# Patient Record
Sex: Female | Born: 1935 | Race: White | Hispanic: No
Health system: Southern US, Community
[De-identification: ages and names within clinical notes are randomized; demographics above are authoritative.]

## PROBLEM LIST (undated history)

## (undated) DIAGNOSIS — D649 Anemia, unspecified: Secondary | ICD-10-CM

## (undated) DIAGNOSIS — K219 Gastro-esophageal reflux disease without esophagitis: Secondary | ICD-10-CM

## (undated) DIAGNOSIS — D72819 Decreased white blood cell count, unspecified: Secondary | ICD-10-CM

## (undated) DIAGNOSIS — G20 Parkinson's disease: Secondary | ICD-10-CM

## (undated) DIAGNOSIS — G20A1 Parkinson's disease without dyskinesia, without mention of fluctuations: Secondary | ICD-10-CM

## (undated) DIAGNOSIS — R7611 Nonspecific reaction to tuberculin skin test without active tuberculosis: Secondary | ICD-10-CM

## (undated) DIAGNOSIS — I219 Acute myocardial infarction, unspecified: Secondary | ICD-10-CM

## (undated) DIAGNOSIS — J45909 Unspecified asthma, uncomplicated: Secondary | ICD-10-CM

## (undated) DIAGNOSIS — I447 Left bundle-branch block, unspecified: Secondary | ICD-10-CM

## (undated) DIAGNOSIS — I2510 Atherosclerotic heart disease of native coronary artery without angina pectoris: Secondary | ICD-10-CM

## (undated) DIAGNOSIS — G4700 Insomnia, unspecified: Secondary | ICD-10-CM

## (undated) DIAGNOSIS — F411 Generalized anxiety disorder: Secondary | ICD-10-CM

## (undated) HISTORY — DX: Insomnia, unspecified: G47.00

## (undated) HISTORY — DX: Parkinson's disease without dyskinesia, without mention of fluctuations: G20.A1

## (undated) HISTORY — DX: Generalized anxiety disorder: F41.1

## (undated) HISTORY — PX: TONSILLECTOMY: SUR1361

## (undated) HISTORY — DX: Parkinson's disease: G20

## (undated) HISTORY — DX: Decreased white blood cell count, unspecified: D72.819

## (undated) HISTORY — DX: Unspecified asthma, uncomplicated: J45.909

## (undated) HISTORY — DX: Anemia, unspecified: D64.9

## (undated) HISTORY — DX: Gastro-esophageal reflux disease without esophagitis: K21.9

## (undated) HISTORY — DX: Nonspecific reaction to tuberculin skin test without active tuberculosis: R76.11

## (undated) HISTORY — DX: Acute myocardial infarction, unspecified: I21.9

## (undated) HISTORY — DX: Left bundle-branch block, unspecified: I44.7

## (undated) HISTORY — DX: Atherosclerotic heart disease of native coronary artery without angina pectoris: I25.10

## (undated) HISTORY — PX: CHOLECYSTECTOMY: SHX55

---

## 1998-01-26 ENCOUNTER — Ambulatory Visit (HOSPITAL_COMMUNITY): Admission: RE | Admit: 1998-01-26 | Discharge: 1998-01-26 | Payer: Self-pay | Admitting: Gastroenterology

## 1998-06-01 ENCOUNTER — Encounter (HOSPITAL_COMMUNITY): Admission: RE | Admit: 1998-06-01 | Discharge: 1998-08-30 | Payer: Self-pay | Admitting: Internal Medicine

## 1998-07-20 ENCOUNTER — Other Ambulatory Visit: Admission: RE | Admit: 1998-07-20 | Discharge: 1998-07-20 | Payer: Self-pay | Admitting: Obstetrics and Gynecology

## 1998-09-01 ENCOUNTER — Encounter (HOSPITAL_COMMUNITY): Admission: RE | Admit: 1998-09-01 | Discharge: 1998-11-30 | Payer: Self-pay | Admitting: Internal Medicine

## 2000-10-18 ENCOUNTER — Other Ambulatory Visit: Admission: RE | Admit: 2000-10-18 | Discharge: 2000-10-18 | Payer: Self-pay | Admitting: Vascular Surgery

## 2000-11-30 ENCOUNTER — Other Ambulatory Visit: Admission: RE | Admit: 2000-11-30 | Discharge: 2000-11-30 | Payer: Self-pay | Admitting: Obstetrics and Gynecology

## 2000-11-30 ENCOUNTER — Encounter (INDEPENDENT_AMBULATORY_CARE_PROVIDER_SITE_OTHER): Payer: Self-pay

## 2002-03-13 ENCOUNTER — Ambulatory Visit (HOSPITAL_COMMUNITY): Admission: RE | Admit: 2002-03-13 | Discharge: 2002-03-13 | Payer: Self-pay | Admitting: Gastroenterology

## 2002-03-13 ENCOUNTER — Encounter: Payer: Self-pay | Admitting: Gastroenterology

## 2002-06-03 ENCOUNTER — Inpatient Hospital Stay (HOSPITAL_COMMUNITY): Admission: EM | Admit: 2002-06-03 | Discharge: 2002-06-05 | Payer: Self-pay | Admitting: Emergency Medicine

## 2004-06-08 ENCOUNTER — Ambulatory Visit: Payer: Self-pay | Admitting: Internal Medicine

## 2004-09-05 ENCOUNTER — Ambulatory Visit: Payer: Self-pay | Admitting: Internal Medicine

## 2004-12-29 ENCOUNTER — Ambulatory Visit: Payer: Self-pay | Admitting: Internal Medicine

## 2006-07-05 ENCOUNTER — Ambulatory Visit: Payer: Self-pay | Admitting: Family Medicine

## 2006-07-05 LAB — CONVERTED CEMR LAB
ALT: 11 units/L (ref 0–40)
AST: 31 units/L (ref 0–37)
Albumin: 4 g/dL (ref 3.5–5.2)
BUN: 19 mg/dL (ref 6–23)
Bilirubin, Direct: 0.1 mg/dL (ref 0.0–0.3)
Chloride: 103 meq/L (ref 96–112)
Cholesterol: 207 mg/dL (ref 0–200)
GFR calc Af Amer: 68 mL/min
Glucose, Bld: 97 mg/dL (ref 70–99)
HDL: 52.5 mg/dL (ref >39.0)
Potassium: 4 meq/L (ref 3.5–5.1)
Total Bilirubin: 0.6 mg/dL (ref 0.3–1.2)
Total CHOL/HDL Ratio: 3.9

## 2006-07-19 ENCOUNTER — Ambulatory Visit: Payer: Self-pay | Admitting: Family Medicine

## 2006-07-26 ENCOUNTER — Ambulatory Visit: Payer: Self-pay | Admitting: Family Medicine

## 2006-07-26 ENCOUNTER — Encounter: Admission: RE | Admit: 2006-07-26 | Discharge: 2006-07-26 | Payer: Self-pay | Admitting: Family Medicine

## 2006-08-03 ENCOUNTER — Ambulatory Visit: Payer: Self-pay | Admitting: Family Medicine

## 2006-08-17 ENCOUNTER — Ambulatory Visit: Payer: Self-pay | Admitting: Internal Medicine

## 2006-12-06 ENCOUNTER — Ambulatory Visit: Payer: Self-pay | Admitting: Family Medicine

## 2006-12-06 DIAGNOSIS — I10 Essential (primary) hypertension: Secondary | ICD-10-CM | POA: Insufficient documentation

## 2006-12-06 DIAGNOSIS — G20 Parkinson's disease: Secondary | ICD-10-CM | POA: Insufficient documentation

## 2006-12-07 DIAGNOSIS — F411 Generalized anxiety disorder: Secondary | ICD-10-CM | POA: Insufficient documentation

## 2006-12-09 LAB — CONVERTED CEMR LAB
BUN: 20 mg/dL (ref 6–23)
CO2: 23 meq/L (ref 19–32)
Calcium: 10.4 mg/dL (ref 8.4–10.5)
Chloride: 104 meq/L (ref 96–112)
Creatinine, Ser: 1.06 mg/dL (ref 0.40–1.20)
Glucose, Bld: 96 mg/dL (ref 70–99)
Potassium: 4.5 meq/L (ref 3.5–5.3)

## 2006-12-10 ENCOUNTER — Telehealth (INDEPENDENT_AMBULATORY_CARE_PROVIDER_SITE_OTHER): Payer: Self-pay | Admitting: Family Medicine

## 2007-02-21 ENCOUNTER — Ambulatory Visit: Payer: Self-pay | Admitting: Internal Medicine

## 2007-03-10 ENCOUNTER — Emergency Department (HOSPITAL_COMMUNITY): Admission: EM | Admit: 2007-03-10 | Discharge: 2007-03-10 | Payer: Self-pay | Admitting: Emergency Medicine

## 2007-03-14 ENCOUNTER — Encounter (INDEPENDENT_AMBULATORY_CARE_PROVIDER_SITE_OTHER): Payer: Self-pay | Admitting: Family Medicine

## 2007-03-19 DIAGNOSIS — I2510 Atherosclerotic heart disease of native coronary artery without angina pectoris: Secondary | ICD-10-CM | POA: Insufficient documentation

## 2007-03-19 DIAGNOSIS — J441 Chronic obstructive pulmonary disease with (acute) exacerbation: Secondary | ICD-10-CM

## 2007-03-19 DIAGNOSIS — K219 Gastro-esophageal reflux disease without esophagitis: Secondary | ICD-10-CM | POA: Insufficient documentation

## 2007-03-19 DIAGNOSIS — I447 Left bundle-branch block, unspecified: Secondary | ICD-10-CM | POA: Insufficient documentation

## 2007-04-08 ENCOUNTER — Encounter (INDEPENDENT_AMBULATORY_CARE_PROVIDER_SITE_OTHER): Payer: Self-pay | Admitting: Family Medicine

## 2007-05-06 ENCOUNTER — Inpatient Hospital Stay (HOSPITAL_COMMUNITY): Admission: EM | Admit: 2007-05-06 | Discharge: 2007-05-10 | Payer: Self-pay | Admitting: Emergency Medicine

## 2007-05-06 ENCOUNTER — Ambulatory Visit: Payer: Self-pay | Admitting: Internal Medicine

## 2007-05-08 ENCOUNTER — Ambulatory Visit: Payer: Self-pay | Admitting: Physical Medicine & Rehabilitation

## 2007-05-10 ENCOUNTER — Ambulatory Visit: Payer: Self-pay | Admitting: Physical Medicine & Rehabilitation

## 2007-05-10 ENCOUNTER — Inpatient Hospital Stay (HOSPITAL_COMMUNITY)
Admission: RE | Admit: 2007-05-10 | Discharge: 2007-05-21 | Payer: Self-pay | Admitting: Physical Medicine & Rehabilitation

## 2007-06-09 ENCOUNTER — Inpatient Hospital Stay (HOSPITAL_COMMUNITY): Admission: EM | Admit: 2007-06-09 | Discharge: 2007-06-10 | Payer: Self-pay | Admitting: Emergency Medicine

## 2007-06-09 ENCOUNTER — Ambulatory Visit: Payer: Self-pay | Admitting: *Deleted

## 2007-06-18 ENCOUNTER — Encounter (INDEPENDENT_AMBULATORY_CARE_PROVIDER_SITE_OTHER): Payer: Self-pay | Admitting: Family Medicine

## 2007-06-20 ENCOUNTER — Telehealth (INDEPENDENT_AMBULATORY_CARE_PROVIDER_SITE_OTHER): Payer: Self-pay | Admitting: *Deleted

## 2007-07-09 ENCOUNTER — Encounter: Payer: Self-pay | Admitting: Internal Medicine

## 2007-07-18 ENCOUNTER — Ambulatory Visit: Payer: Self-pay | Admitting: Family Medicine

## 2007-07-19 ENCOUNTER — Encounter (INDEPENDENT_AMBULATORY_CARE_PROVIDER_SITE_OTHER): Payer: Self-pay | Admitting: *Deleted

## 2007-07-19 LAB — CONVERTED CEMR LAB
CO2: 30 meq/L (ref 19–32)
Calcium: 10.1 mg/dL (ref 8.4–10.5)
Creatinine, Ser: 1 mg/dL (ref 0.4–1.2)
GFR calc Af Amer: 68 mL/min
Potassium: 4.7 meq/L (ref 3.5–5.1)

## 2007-07-26 ENCOUNTER — Ambulatory Visit: Payer: Self-pay | Admitting: Internal Medicine

## 2007-08-07 ENCOUNTER — Telehealth (INDEPENDENT_AMBULATORY_CARE_PROVIDER_SITE_OTHER): Payer: Self-pay | Admitting: *Deleted

## 2007-08-08 ENCOUNTER — Ambulatory Visit: Payer: Self-pay | Admitting: Family Medicine

## 2007-08-13 LAB — CONVERTED CEMR LAB
Basophils Absolute: 0 10*3/uL (ref 0.0–0.1)
Eosinophils Absolute: 0.1 10*3/uL (ref 0.0–0.6)
Eosinophils Relative: 1.4 % (ref 0.0–5.0)
HCT: 35.9 % — ABNORMAL LOW (ref 36.0–46.0)
MCHC: 33.1 g/dL (ref 30.0–36.0)

## 2007-08-14 ENCOUNTER — Telehealth (INDEPENDENT_AMBULATORY_CARE_PROVIDER_SITE_OTHER): Payer: Self-pay | Admitting: *Deleted

## 2007-08-15 ENCOUNTER — Telehealth (INDEPENDENT_AMBULATORY_CARE_PROVIDER_SITE_OTHER): Payer: Self-pay | Admitting: *Deleted

## 2007-08-15 ENCOUNTER — Ambulatory Visit: Payer: Self-pay | Admitting: Family Medicine

## 2007-08-15 DIAGNOSIS — D72819 Decreased white blood cell count, unspecified: Secondary | ICD-10-CM | POA: Insufficient documentation

## 2007-08-15 DIAGNOSIS — D649 Anemia, unspecified: Secondary | ICD-10-CM

## 2007-08-15 LAB — CONVERTED CEMR LAB
Basophils Absolute: 0 10*3/uL (ref 0.0–0.1)
Eosinophils Absolute: 0.1 10*3/uL (ref 0.0–0.6)
Lymphocytes Relative: 36 % (ref 12.0–46.0)
MCV: 88.4 fL (ref 78.0–100.0)
Monocytes Absolute: 0.2 10*3/uL (ref 0.2–0.7)
Monocytes Relative: 5 % (ref 3.0–11.0)
Neutro Abs: 2.5 10*3/uL (ref 1.4–7.7)
Neutrophils Relative %: 56.6 % (ref 43.0–77.0)
WBC: 4.3 10*3/uL — ABNORMAL LOW (ref 4.5–10.5)

## 2007-08-19 ENCOUNTER — Encounter (INDEPENDENT_AMBULATORY_CARE_PROVIDER_SITE_OTHER): Payer: Self-pay | Admitting: *Deleted

## 2007-08-20 ENCOUNTER — Ambulatory Visit: Payer: Self-pay | Admitting: Hematology and Oncology

## 2007-08-23 ENCOUNTER — Telehealth (INDEPENDENT_AMBULATORY_CARE_PROVIDER_SITE_OTHER): Payer: Self-pay | Admitting: *Deleted

## 2007-10-24 ENCOUNTER — Encounter: Payer: Self-pay | Admitting: Internal Medicine

## 2007-11-14 ENCOUNTER — Ambulatory Visit: Payer: Self-pay | Admitting: Internal Medicine

## 2007-11-14 DIAGNOSIS — G4700 Insomnia, unspecified: Secondary | ICD-10-CM | POA: Insufficient documentation

## 2008-01-02 ENCOUNTER — Ambulatory Visit: Payer: Self-pay | Admitting: Internal Medicine

## 2008-01-09 ENCOUNTER — Ambulatory Visit: Payer: Self-pay | Admitting: Internal Medicine

## 2008-01-09 DIAGNOSIS — R82998 Other abnormal findings in urine: Secondary | ICD-10-CM | POA: Insufficient documentation

## 2008-03-16 ENCOUNTER — Telehealth (INDEPENDENT_AMBULATORY_CARE_PROVIDER_SITE_OTHER): Payer: Self-pay | Admitting: *Deleted

## 2008-03-19 ENCOUNTER — Ambulatory Visit: Payer: Self-pay | Admitting: Internal Medicine

## 2008-03-30 ENCOUNTER — Telehealth (INDEPENDENT_AMBULATORY_CARE_PROVIDER_SITE_OTHER): Payer: Self-pay | Admitting: *Deleted

## 2008-04-28 ENCOUNTER — Telehealth: Payer: Self-pay | Admitting: Internal Medicine

## 2008-05-05 HISTORY — PX: ORIF HIP FRACTURE: SHX2125

## 2008-05-12 ENCOUNTER — Telehealth: Payer: Self-pay | Admitting: Family Medicine

## 2008-05-13 ENCOUNTER — Telehealth: Payer: Self-pay | Admitting: Internal Medicine

## 2008-05-21 ENCOUNTER — Ambulatory Visit: Payer: Self-pay | Admitting: Internal Medicine

## 2008-05-26 ENCOUNTER — Telehealth: Payer: Self-pay | Admitting: Internal Medicine

## 2008-06-29 ENCOUNTER — Telehealth: Payer: Self-pay | Admitting: Internal Medicine

## 2008-07-02 ENCOUNTER — Ambulatory Visit: Payer: Self-pay | Admitting: Internal Medicine

## 2008-07-09 ENCOUNTER — Ambulatory Visit: Payer: Self-pay | Admitting: Internal Medicine

## 2008-07-09 DIAGNOSIS — R0609 Other forms of dyspnea: Secondary | ICD-10-CM | POA: Insufficient documentation

## 2008-07-09 DIAGNOSIS — R0989 Other specified symptoms and signs involving the circulatory and respiratory systems: Secondary | ICD-10-CM

## 2008-07-10 ENCOUNTER — Encounter: Payer: Self-pay | Admitting: Internal Medicine

## 2008-07-16 ENCOUNTER — Ambulatory Visit: Payer: Self-pay | Admitting: Diagnostic Radiology

## 2008-07-16 ENCOUNTER — Emergency Department (HOSPITAL_BASED_OUTPATIENT_CLINIC_OR_DEPARTMENT_OTHER): Admission: EM | Admit: 2008-07-16 | Discharge: 2008-07-16 | Payer: Self-pay | Admitting: Emergency Medicine

## 2008-08-04 ENCOUNTER — Telehealth (INDEPENDENT_AMBULATORY_CARE_PROVIDER_SITE_OTHER): Payer: Self-pay | Admitting: *Deleted

## 2008-09-11 ENCOUNTER — Telehealth (INDEPENDENT_AMBULATORY_CARE_PROVIDER_SITE_OTHER): Payer: Self-pay | Admitting: *Deleted

## 2008-09-17 ENCOUNTER — Telehealth (INDEPENDENT_AMBULATORY_CARE_PROVIDER_SITE_OTHER): Payer: Self-pay | Admitting: *Deleted

## 2008-10-13 ENCOUNTER — Telehealth (INDEPENDENT_AMBULATORY_CARE_PROVIDER_SITE_OTHER): Payer: Self-pay | Admitting: *Deleted

## 2008-10-20 ENCOUNTER — Ambulatory Visit: Payer: Self-pay | Admitting: Internal Medicine

## 2008-11-03 ENCOUNTER — Telehealth (INDEPENDENT_AMBULATORY_CARE_PROVIDER_SITE_OTHER): Payer: Self-pay | Admitting: *Deleted

## 2008-11-05 ENCOUNTER — Telehealth: Payer: Self-pay | Admitting: Internal Medicine

## 2008-11-06 ENCOUNTER — Telehealth (INDEPENDENT_AMBULATORY_CARE_PROVIDER_SITE_OTHER): Payer: Self-pay | Admitting: *Deleted

## 2008-12-23 ENCOUNTER — Telehealth (INDEPENDENT_AMBULATORY_CARE_PROVIDER_SITE_OTHER): Payer: Self-pay | Admitting: *Deleted

## 2008-12-24 ENCOUNTER — Encounter: Admission: RE | Admit: 2008-12-24 | Discharge: 2009-03-08 | Payer: Self-pay | Admitting: Neurology

## 2009-01-05 ENCOUNTER — Ambulatory Visit: Payer: Self-pay | Admitting: Internal Medicine

## 2009-01-05 ENCOUNTER — Telehealth (INDEPENDENT_AMBULATORY_CARE_PROVIDER_SITE_OTHER): Payer: Self-pay | Admitting: *Deleted

## 2009-01-06 ENCOUNTER — Telehealth: Payer: Self-pay | Admitting: Internal Medicine

## 2009-01-07 ENCOUNTER — Telehealth (INDEPENDENT_AMBULATORY_CARE_PROVIDER_SITE_OTHER): Payer: Self-pay | Admitting: *Deleted

## 2009-01-13 ENCOUNTER — Ambulatory Visit: Payer: Self-pay | Admitting: Internal Medicine

## 2009-01-13 DIAGNOSIS — R197 Diarrhea, unspecified: Secondary | ICD-10-CM | POA: Insufficient documentation

## 2009-01-13 DIAGNOSIS — R109 Unspecified abdominal pain: Secondary | ICD-10-CM | POA: Insufficient documentation

## 2009-01-13 LAB — CONVERTED CEMR LAB
Basophils Absolute: 0 10*3/uL (ref 0.0–0.1)
Basophils Relative: 0.6 % (ref 0.0–3.0)
Eosinophils Relative: 1.1 % (ref 0.0–5.0)
HCT: 35.5 % — ABNORMAL LOW (ref 36.0–46.0)
Hemoglobin: 12.2 g/dL (ref 12.0–15.0)
MCHC: 34.3 g/dL (ref 30.0–36.0)
MCV: 91.5 fL (ref 78.0–100.0)
Monocytes Absolute: 0.2 10*3/uL (ref 0.1–1.0)
Monocytes Relative: 4.4 % (ref 3.0–12.0)
Neutrophils Relative %: 67.6 % (ref 43.0–77.0)
RBC: 3.88 M/uL (ref 3.87–5.11)
RDW: 14.3 % (ref 11.5–14.6)
WBC: 5.2 10*3/uL (ref 4.5–10.5)

## 2009-01-14 ENCOUNTER — Encounter (INDEPENDENT_AMBULATORY_CARE_PROVIDER_SITE_OTHER): Payer: Self-pay | Admitting: *Deleted

## 2009-01-15 ENCOUNTER — Telehealth (INDEPENDENT_AMBULATORY_CARE_PROVIDER_SITE_OTHER): Payer: Self-pay | Admitting: *Deleted

## 2009-01-19 ENCOUNTER — Encounter: Payer: Self-pay | Admitting: Internal Medicine

## 2009-01-22 ENCOUNTER — Telehealth: Payer: Self-pay | Admitting: Internal Medicine

## 2009-02-09 IMAGING — CR DG HIP (WITH OR WITHOUT PELVIS) 2-3V*L*
3 series · 3 of 3 positions shown · non-contrast
Comparison: 07/26/06.

CLINICAL DATA: Preoperative respiratory exam for hip fracture. History of asthma and COPD.
 CHEST - 1 VIEW ? 05/06/07 AT 0366 HOURS:

[t hip ap left]
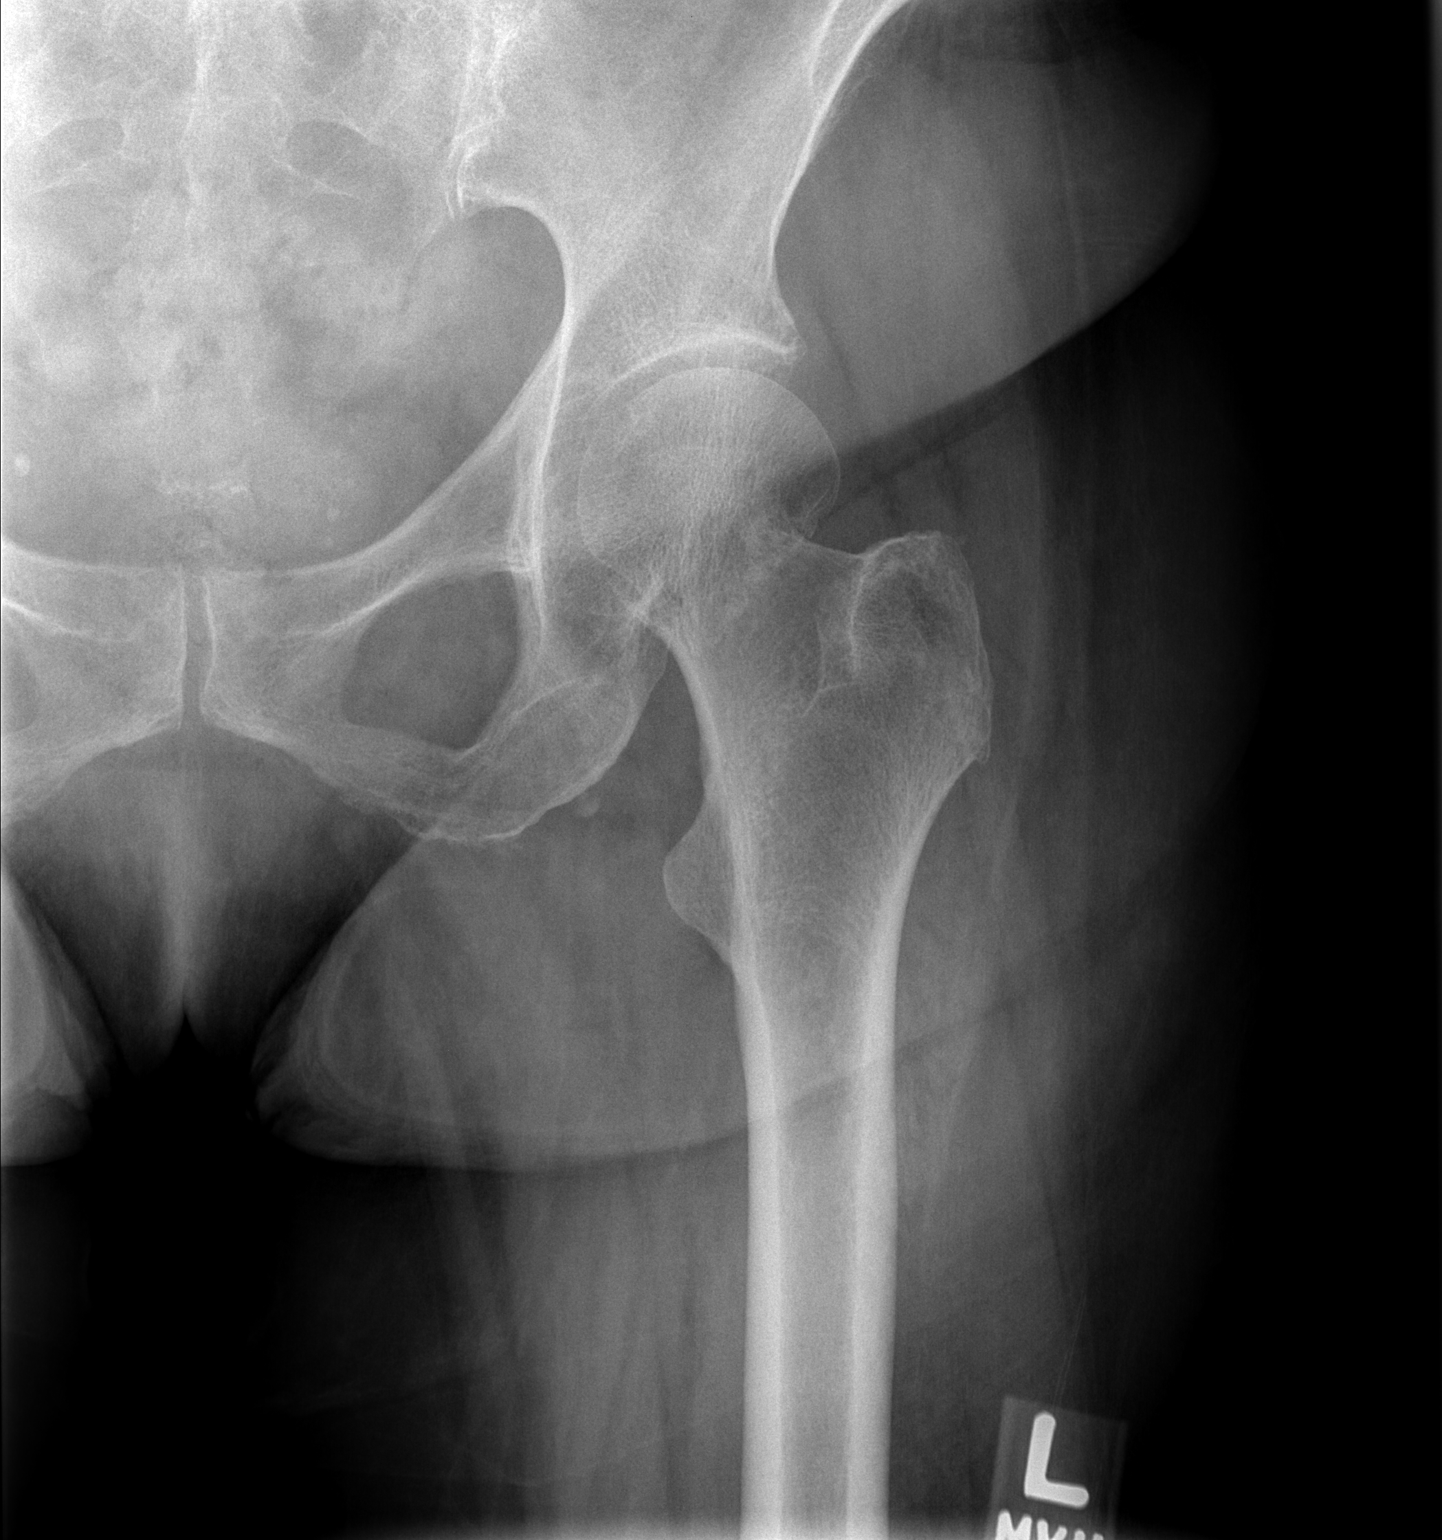

[t pelvis a.p.]
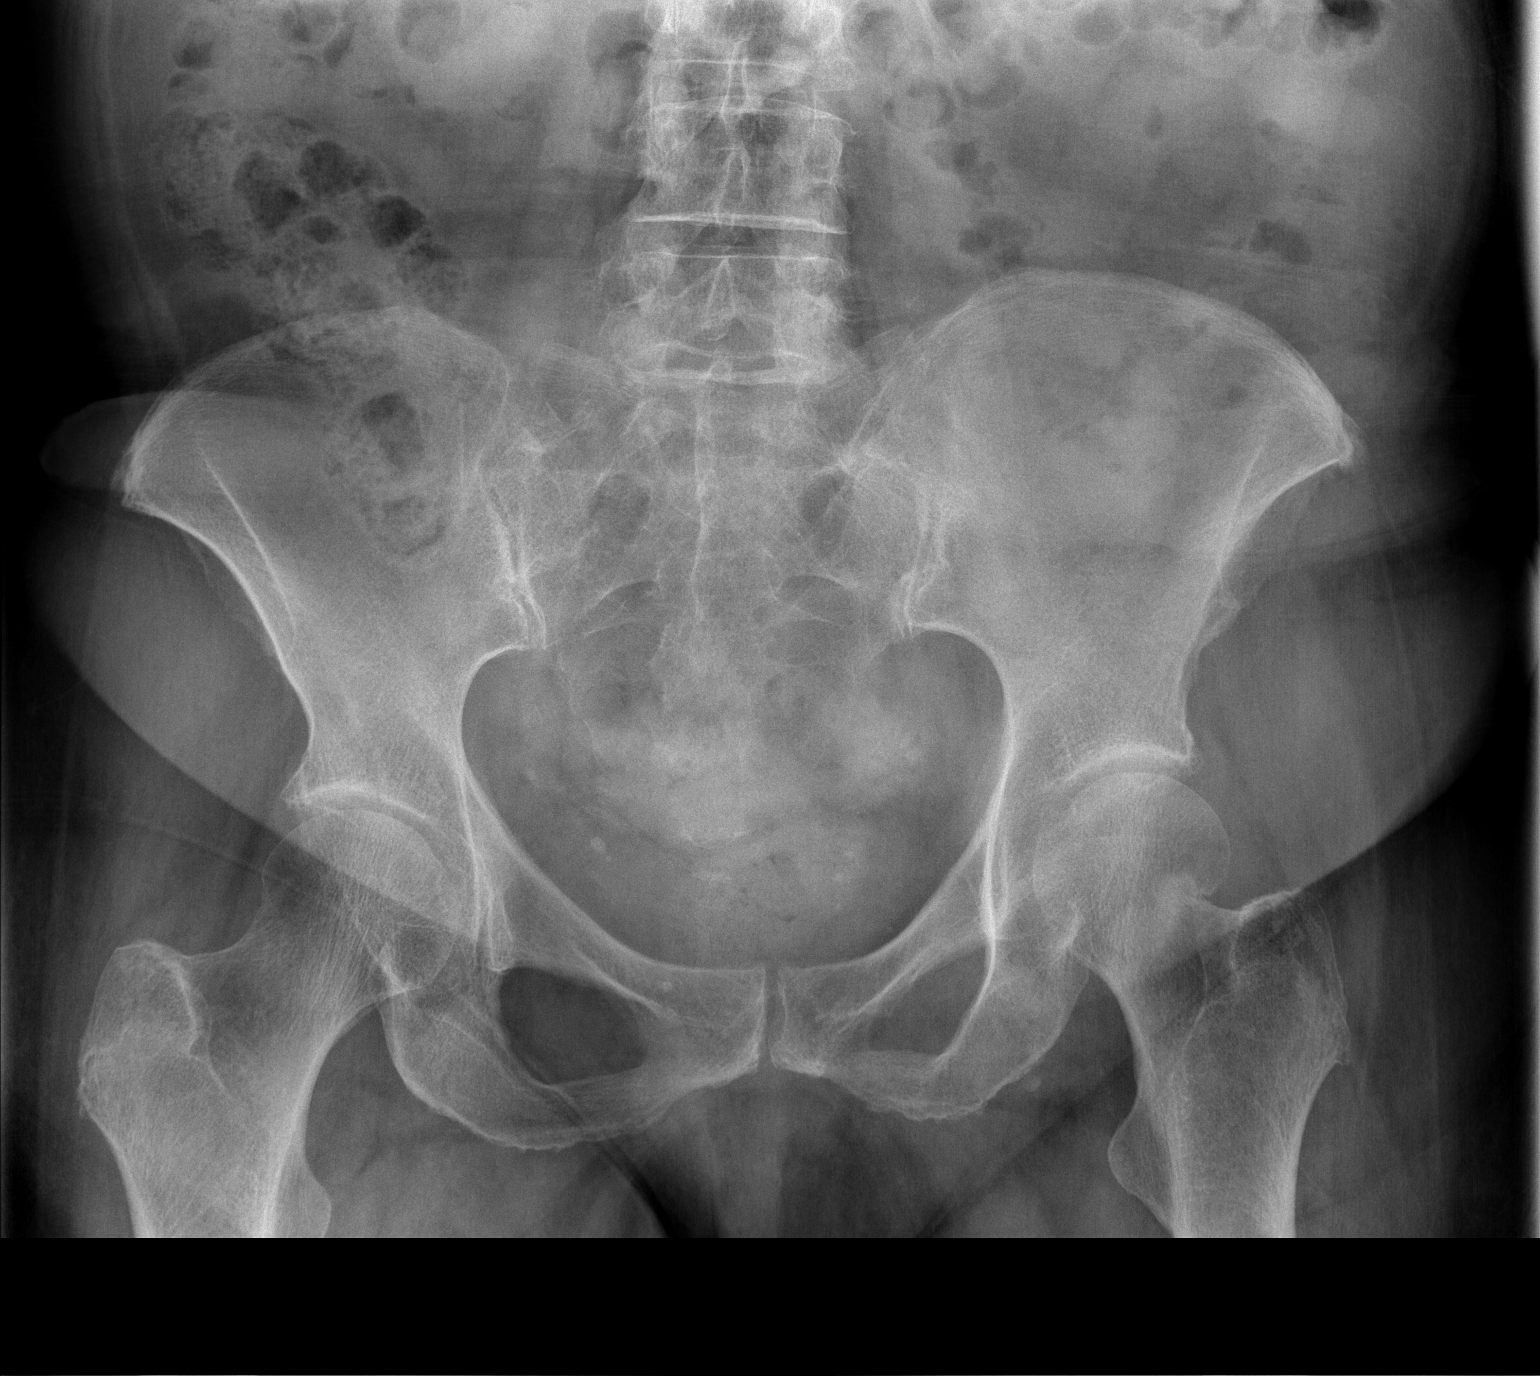

[w hip lateral left *]
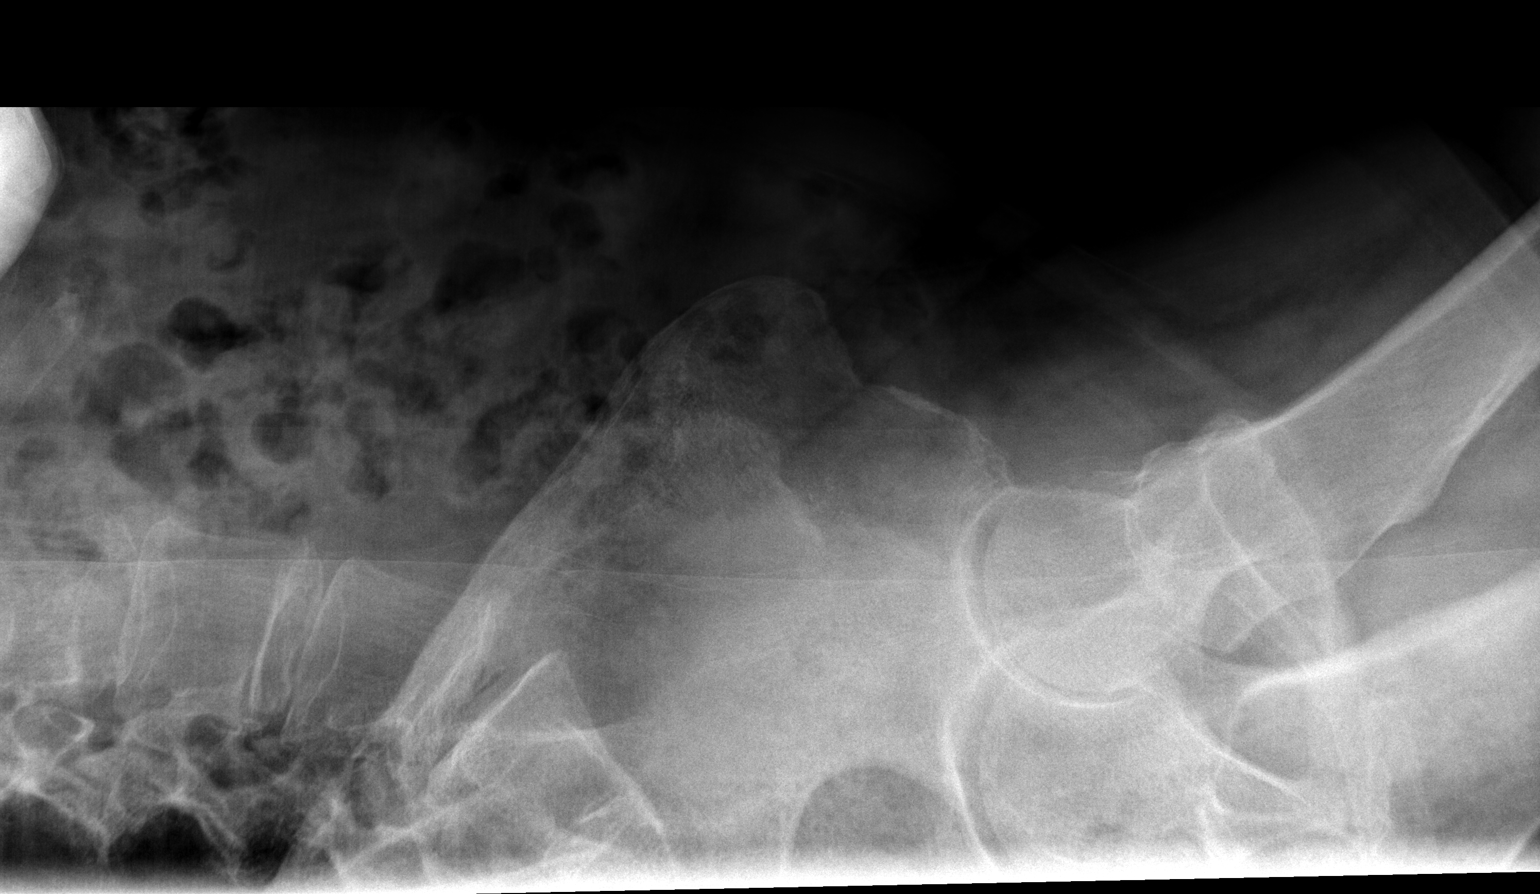

[3 of 3 positions shown; findings below may reference images not displayed]

FINDINGS: Heart size is normal. There is bronchial thickening and some pulmonary scarring, but no evidence of active infiltrate, mass, effusion, or collapse.
IMPRESSION: Bronchial thickening without consolidation or collapse.
 LEFT HIP ? 3 VIEW:
FINDINGS: The patient has a femoral neck fracture angulated medially.  No other regional fracture identified.
IMPRESSION: Left femoral neck fracture.

## 2009-02-15 ENCOUNTER — Telehealth: Payer: Self-pay | Admitting: Internal Medicine

## 2009-02-19 ENCOUNTER — Telehealth: Payer: Self-pay | Admitting: Internal Medicine

## 2009-02-26 ENCOUNTER — Ambulatory Visit: Payer: Self-pay | Admitting: Family Medicine

## 2009-02-26 DIAGNOSIS — K5289 Other specified noninfective gastroenteritis and colitis: Secondary | ICD-10-CM

## 2009-02-26 LAB — CONVERTED CEMR LAB
AST: 45 units/L — ABNORMAL HIGH (ref 0–37)
Albumin: 4.2 g/dL (ref 3.5–5.2)
Alkaline Phosphatase: 73 units/L (ref 39–117)
Bilirubin, Direct: 0.3 mg/dL (ref 0.0–0.3)
Eosinophils Relative: 1 % (ref 0–5)
HCT: 40.5 % (ref 36.0–46.0)
Hemoglobin: 13.2 g/dL (ref 12.0–15.0)
Indirect Bilirubin: 0.8 mg/dL (ref 0.0–0.9)
Lymphocytes Relative: 14 % (ref 12–46)
Neutro Abs: 6.7 10*3/uL (ref 1.7–7.7)
Neutrophils Relative %: 79 % — ABNORMAL HIGH (ref 43–77)
Platelets: 157 10*3/uL (ref 150–400)
Potassium: 4.5 meq/L (ref 3.5–5.3)
RBC: 4.44 M/uL (ref 3.87–5.11)
RDW: 13.7 % (ref 11.5–15.5)
Sodium: 140 meq/L (ref 135–145)
Total Protein: 6.8 g/dL (ref 6.0–8.3)
WBC: 8.5 10*3/uL (ref 4.0–10.5)

## 2009-03-04 ENCOUNTER — Ambulatory Visit: Payer: Self-pay | Admitting: Internal Medicine

## 2009-03-04 DIAGNOSIS — R5381 Other malaise: Secondary | ICD-10-CM

## 2009-03-04 DIAGNOSIS — R5383 Other fatigue: Secondary | ICD-10-CM

## 2009-03-05 ENCOUNTER — Encounter: Payer: Self-pay | Admitting: Internal Medicine

## 2009-03-12 ENCOUNTER — Telehealth: Payer: Self-pay | Admitting: Internal Medicine

## 2009-03-12 ENCOUNTER — Encounter: Payer: Self-pay | Admitting: Internal Medicine

## 2009-03-19 ENCOUNTER — Telehealth (INDEPENDENT_AMBULATORY_CARE_PROVIDER_SITE_OTHER): Payer: Self-pay | Admitting: *Deleted

## 2009-03-26 ENCOUNTER — Telehealth (INDEPENDENT_AMBULATORY_CARE_PROVIDER_SITE_OTHER): Payer: Self-pay | Admitting: *Deleted

## 2009-03-29 ENCOUNTER — Encounter: Payer: Self-pay | Admitting: Internal Medicine

## 2009-04-05 ENCOUNTER — Telehealth (INDEPENDENT_AMBULATORY_CARE_PROVIDER_SITE_OTHER): Payer: Self-pay | Admitting: *Deleted

## 2009-04-09 ENCOUNTER — Telehealth (INDEPENDENT_AMBULATORY_CARE_PROVIDER_SITE_OTHER): Payer: Self-pay | Admitting: *Deleted

## 2009-05-10 ENCOUNTER — Telehealth (INDEPENDENT_AMBULATORY_CARE_PROVIDER_SITE_OTHER): Payer: Self-pay | Admitting: *Deleted

## 2009-05-10 ENCOUNTER — Ambulatory Visit: Payer: Self-pay | Admitting: Internal Medicine

## 2009-05-10 DIAGNOSIS — R1084 Generalized abdominal pain: Secondary | ICD-10-CM | POA: Insufficient documentation

## 2009-05-10 DIAGNOSIS — K625 Hemorrhage of anus and rectum: Secondary | ICD-10-CM | POA: Insufficient documentation

## 2009-05-12 LAB — CONVERTED CEMR LAB
Basophils Relative: 1.8 % (ref 0.0–3.0)
Eosinophils Relative: 2.3 % (ref 0.0–5.0)
Lymphocytes Relative: 35.5 % (ref 12.0–46.0)
Lymphs Abs: 1.6 10*3/uL (ref 0.7–4.0)
MCHC: 33.9 g/dL (ref 30.0–36.0)
Monocytes Absolute: 0.1 10*3/uL (ref 0.1–1.0)
Monocytes Relative: 3 % (ref 3.0–12.0)
Platelets: 115 10*3/uL — ABNORMAL LOW (ref 150.0–400.0)
RBC: 4.07 M/uL (ref 3.87–5.11)
RDW: 13.4 % (ref 11.5–14.6)
WBC: 4.4 10*3/uL — ABNORMAL LOW (ref 4.5–10.5)

## 2009-05-24 ENCOUNTER — Telehealth: Payer: Self-pay | Admitting: Internal Medicine

## 2009-05-24 ENCOUNTER — Telehealth (INDEPENDENT_AMBULATORY_CARE_PROVIDER_SITE_OTHER): Payer: Self-pay | Admitting: *Deleted

## 2009-05-27 ENCOUNTER — Ambulatory Visit: Payer: Self-pay | Admitting: Internal Medicine

## 2009-05-27 ENCOUNTER — Encounter (INDEPENDENT_AMBULATORY_CARE_PROVIDER_SITE_OTHER): Payer: Self-pay | Admitting: *Deleted

## 2009-05-27 LAB — CONVERTED CEMR LAB
OCCULT 1: NEGATIVE
OCCULT 2: NEGATIVE
OCCULT 3: NEGATIVE

## 2009-06-23 ENCOUNTER — Ambulatory Visit: Payer: Self-pay | Admitting: Internal Medicine

## 2009-06-24 ENCOUNTER — Telehealth (INDEPENDENT_AMBULATORY_CARE_PROVIDER_SITE_OTHER): Payer: Self-pay | Admitting: *Deleted

## 2009-07-01 ENCOUNTER — Ambulatory Visit: Payer: Self-pay | Admitting: Internal Medicine

## 2009-07-02 ENCOUNTER — Telehealth: Payer: Self-pay | Admitting: Internal Medicine

## 2009-07-02 ENCOUNTER — Ambulatory Visit (HOSPITAL_BASED_OUTPATIENT_CLINIC_OR_DEPARTMENT_OTHER): Admission: RE | Admit: 2009-07-02 | Discharge: 2009-07-02 | Payer: Self-pay | Admitting: Internal Medicine

## 2009-07-02 ENCOUNTER — Ambulatory Visit: Payer: Self-pay | Admitting: Diagnostic Radiology

## 2009-07-14 ENCOUNTER — Telehealth: Payer: Self-pay | Admitting: Internal Medicine

## 2009-07-23 ENCOUNTER — Encounter: Payer: Self-pay | Admitting: Internal Medicine

## 2009-08-03 ENCOUNTER — Telehealth (INDEPENDENT_AMBULATORY_CARE_PROVIDER_SITE_OTHER): Payer: Self-pay | Admitting: *Deleted

## 2009-08-20 ENCOUNTER — Telehealth (INDEPENDENT_AMBULATORY_CARE_PROVIDER_SITE_OTHER): Payer: Self-pay | Admitting: *Deleted

## 2009-10-01 ENCOUNTER — Telehealth (INDEPENDENT_AMBULATORY_CARE_PROVIDER_SITE_OTHER): Payer: Self-pay | Admitting: *Deleted

## 2009-12-03 ENCOUNTER — Telehealth (INDEPENDENT_AMBULATORY_CARE_PROVIDER_SITE_OTHER): Payer: Self-pay | Admitting: *Deleted

## 2009-12-30 ENCOUNTER — Ambulatory Visit: Payer: Self-pay | Admitting: Internal Medicine

## 2010-01-06 ENCOUNTER — Telehealth (INDEPENDENT_AMBULATORY_CARE_PROVIDER_SITE_OTHER): Payer: Self-pay | Admitting: *Deleted

## 2010-01-19 ENCOUNTER — Telehealth: Payer: Self-pay | Admitting: Internal Medicine

## 2010-03-03 ENCOUNTER — Telehealth: Payer: Self-pay | Admitting: Internal Medicine

## 2010-03-03 ENCOUNTER — Encounter: Payer: Self-pay | Admitting: Internal Medicine

## 2010-03-21 ENCOUNTER — Telehealth: Payer: Self-pay | Admitting: Internal Medicine

## 2010-03-22 ENCOUNTER — Telehealth: Payer: Self-pay | Admitting: Internal Medicine

## 2010-03-25 ENCOUNTER — Ambulatory Visit: Payer: Self-pay | Admitting: Internal Medicine

## 2010-03-25 ENCOUNTER — Telehealth (INDEPENDENT_AMBULATORY_CARE_PROVIDER_SITE_OTHER): Payer: Self-pay | Admitting: *Deleted

## 2010-04-15 ENCOUNTER — Telehealth: Payer: Self-pay | Admitting: Internal Medicine

## 2010-05-12 ENCOUNTER — Ambulatory Visit: Payer: Self-pay | Admitting: Internal Medicine
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
## 2010-07-05 NOTE — Progress Notes (Signed)
Summary: results  Phone Note Call from Patient   Caller: Patient Call For: Chidiebere Wynn Summary of Call: pt asks that nurse or dr Genean Adamski call her daughter ms. nance w/ recent results. daughter's # [phone] Initial call taken by: Tivis Ringer, CNA,  July 02, 2009 2:39 PM  Follow-up for Phone Call        Florentina Addison i dont see any results in here from [facility]---is there anything pending that you are waiting on??  thanks Randell Loop CMA  July 02, 2009 2:49 PM     CDY, please review and advise of CXR results.Reynaldo Minium CMA  July 05, 2009 9:16 AM   Additional Follow-up for Phone Call Additional follow up Details #1::        done Additional Follow-up by: Waymon Budge MD,  July 05, 2009 5:11 PM     +

## 2010-07-05 NOTE — Progress Notes (Signed)
Summary: pt very sob  Phone Note Call from Patient Call back at Home Phone [phone]   Caller: Patient Call For: young Reason for Call: Talk to Nurse Summary of Call: Patient calling w/complaint of being very sob.  Dr. [name] called in prednisone eariler in the week, but it is no longer helping.  Requesting to see Dr. [name]. Initial call taken by: Lehman Prom,  March 25, 2010 9:37 AM  Follow-up for Phone Call        Spoke with pt.  She is c/o increased SOB x 2 days, cough with grey sputum and wheezing.  Spoke with Florentina Addison and she states okay to add pt on at 10:45.  Pt states that she does have a ride and will be here at 10:45 Follow-up by: Vernie Murders,  March 25, 2010 10:01 AM

## 2010-07-05 NOTE — Progress Notes (Signed)
Summary: status of papers  Phone Note Call from Patient   Caller: Daughter-martha nance Call For: young Summary of Call: daughter wants to know status of papers dropped off at front office last fri re: veteran survivor benefits. they were to be mailed back to caller. [phone] Initial call taken by: Tivis Ringer, CNA,  March 03, 2010 9:51 AM  Follow-up for Phone Call        Katie have you seen these papers on pt?  Aundra Millet Reynolds LPN  March 03, 2010 10:18 AM    spoke with [place]; she is aware that CDY will only comment about asthma for pt-daughter states this is exactly what is needed; will have CDY complete and mail to pt's daughter is self addressed envelope.Reynaldo Minium CMA  March 03, 2010 10:58 AM   Additional Follow-up for Phone Call Additional follow up Details #1::        Form comleted as noted. Additional Follow-up by: Waymon Budge MD,  March 03, 2010 1:04 PM

## 2010-07-05 NOTE — Miscellaneous (Signed)
Summary: Care Plan/[facility]  Care Plan/[facility]   Imported By: Lanelle Bal 07/07/2009 09:39:25  _____________________________________________________________________  External Attachment:    Type:   Image     Comment:   External Document
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
## 2010-07-05 NOTE — Progress Notes (Signed)
Summary: refill for valium  Phone Note Call from Patient Call back at Home Phone [phone]   Caller: Patient Call For: young Reason for Call: Refill Medication, Talk to Nurse Summary of Call: want refill on Valium - pt is completely out.  Need today [facility] Initial call taken by: Eugene Gavia,  August 03, 2009 4:08 PM    Prescriptions: VALIUM 5 MG  TABS (DIAZEPAM) TAKE ONE TABLET TWICE DAILY as needed  #60 x 3   Entered by:   Philipp Deputy CMA   Authorized by:   Waymon Budge MD   Signed by:   Philipp Deputy CMA on 08/03/2009   Method used:   Telephoned to ...       [facility]* (retail)       [address]       [place], [place]  [address]       Ph: [phone]       Fax: [phone]   RxID:   [id number]

## 2010-07-05 NOTE — Progress Notes (Signed)
Summary: Ward Chatters vs symbicort/advair  Phone Note Call from Patient   Caller: Patient Call For: YOUNG Summary of Call: NEED TO TALK TO NURSE ABOUT DULERA INHALER. Initial call taken by: Rickard Patience,  January 06, 2010 10:09 AM  Follow-up for Phone Call        PT reports that Elwin Sleight is not covered under her insurance.  Pt reports that she has felt better and is not coughing as much since starting this.  Called [facility] [phone]) to see what inhalers are on pt's formulary, Pharmacist reports that Advair and Symbicort are on formulary.  Please advise Abigail Miyamoto RN  January 06, 2010 10:24 AM    Additional Follow-up for Phone Call Additional follow up Details #1::        She will need to choose which one she wants. Her insurance won't cover [facility], so she can't stay on that one. She has used both Advair and symbicort before.    Additional Follow-up for Phone Call Additional follow up Details #2::    LMOMTCB x1. Abigail Miyamoto RN  January 06, 2010 1:46 PM   called and spoke with pt.  pt states she wishes to continue on [facility] sample for now but will call us when she is getting low regarding switching to Symbiocr to Advair.  Pt states she will probably want to switch to Advair but will call us back regarding this.  nothing further needed.  Aundra Millet Reynolds LPN  January 06, 2010 2:12 PM

## 2010-07-05 NOTE — Progress Notes (Signed)
Summary: appt  Phone Note Call from Patient Call back at Home Phone [phone]   Caller: Patient Call For: young Reason for Call: Talk to Nurse Summary of Call: pt wants to speak with the nurse.  Want to discuss when she should come in next.  Said she has been feeling ok for 77 years old. Initial call taken by: Eugene Gavia,  December 03, 2009 1:18 PM  Follow-up for Phone Call        Called and spoke with pt.  She was last seen in Jan 2011 and advised to followup in 4 months.  She states that she has been doing well but now that she knows she is overdue to see Dr [name], she will go ahead and resched.  Appt sched for 12/30/09 at 3:15 pm. Follow-up by: Vernie Murders,  December 03, 2009 2:28 PM

## 2010-07-05 NOTE — Progress Notes (Signed)
Summary: resp co. info  Phone Note Call from Patient   Caller: Daughter Call For: Read Bonelli Summary of Call: fyi for dr Rakim Moone: [facility] [phone]. caller is daughter ms. nance [phone] Initial call taken by: Tivis Ringer, CNA,  July 02, 2009 12:10 PM  Follow-up for Phone Call        [name] this is an fyi for [name]--not sure if you needed to know this as well.  thanks [name] CMA  July 02, 2009 12:14 PM

## 2010-07-05 NOTE — Assessment & Plan Note (Signed)
Summary: 1 mth f/u//jrc   Primary Provider/Referring Provider:  Dr Nolon Nations   CC:  1 month follow up visit-asthma; breathing better since last visit.Marland Kitchen  History of Present Illness: December 30, 2009- Asthma.........................daughter here She thinks she has had more asthma this year since the spring. She is using Advair less at night, blaming it for insomnia. She doesn't like the Advair dispenser as well as a metered inhaler. They asked about the experience with Daliresp and are going to wait on that awhile. Uses Proair 1-2x/ week. Uses nebulizer at least once daily.  March 25, 2010- Asthma Nurse CC: Pt c/o increased SOB x 1 week.  Pt also c/o increased congestion and productive cough with grey phlegm. She had called with cough last week, was given predniosne and initially much better. Now, with 1-2 days of prednisone left, had begu increased cough 5 days ago. Cough productive gray phlegm, chest tight and SOB. Denies fever, chest pain, sore throat, GI upset. Couldn't sleep early this AM. Used her neb for temporary help.   May 12, 2010- Asthma.........daughter here 1 month follow up visit-asthma; breathing better since last visit. Gets dyspneic if she gets up and walks to bathroom, relieved by rest. Discussed use of Dulera, rescue inhalers and nebulizer. Uses nebulizer 1-2x/ month. Denies chest pain, palpitation, fever, discolored sputum.  CXR- 110/21/11- Chronic bronchitis     Preventive Screening-Counseling & Management  Alcohol-Tobacco     Smoking Status: never  Current Medications (verified): 1)  Norvasc 5 Mg  Tabs (Amlodipine Besylate) .... Take One Tablet Daily 2)  Tylenol Extra Strength 500 Mg  Tabs (Acetaminophen) .... Use As Directed 3)  Multivitamin/iron   Tabs (Multiple Vitamins-Iron) .... Use As Directed. 4)  Bl Vitamin E 1000 Unit  Caps (Vitamin E) .... Use As Directed. 5)  Adult Aspirin Low Strength 81 Mg  Chew (Aspirin) .... Take One Tablet Daily. 6)   Albertsons Calcium   Tabs (Calcium Tabs) .... Use As Directed. 7)  Gaviscon Acid Brkthrgh Formula 500 Mg  Chew (Calcium Carbonate Antacid) .... As Needed 8)  Valium 5 Mg  Tabs (Diazepam) .... Take One Tablet Twice Daily As Needed 9)  Nitro-Dur 0.4 Mg/hr  Pt24 (Nitroglycerin) .... As Needed 10)  Albuterol Sulfate (2.5 Mg/14ml) 0.083%  Nebu (Albuterol Sulfate) .Marland Kitchen.. 1 Vial in Nebulizer Up To 4 Times Daily As Needed 11)  Dulera 100-5 Mcg/act Aero (Mometasone Furo-Formoterol Fum) .... 2 Puffs and Rinse, Twice Daily 12)  Zyrtec Allergy 10 Mg Caps (Cetirizine Hcl) .Marland Kitchen.. 1 At Bedtime 13)  Proair Hfa 108 (90 Base) Mcg/act Aers (Albuterol Sulfate) .... 2 Puffs Four Times A Day As Needed 14)  Benzonatate 100 Mg Caps (Benzonatate) .Marland Kitchen.. 1 -2 Three Times A Day As Needed Cough  Allergies (verified): 1)  ! Morphine 2)  ! * Dilaudid 3)  Codeine 4)  Tussionex Pennkinetic Er (Hydrocod Polst-Chlorphen Polst) 5)  Hydrocodone  Past History:  Past Medical History: Last updated: 03/19/2008 INSOMNIA (ICD-780.52) LEUKOPENIA, MILD (ICD-288.50) ANEMIA (ICD-285.9) LEFT BUNDLE BRANCH BLOCK (ICD-426.3) CAD (ICD-414.00) MI GERD (ICD-530.81) ASTHMA (ICD-493.90) PARKINSON'S DISEASE (ICD-332.0) ANXIETY (ICD-300.00) HYPERTENSION (ICD-401.9) Hx + PPD    Past Surgical History: Last updated: 05/21/2008 ORIF hip fx 12/09, partial hip replacement Cholecystectomy Tonsillectomy right lower leg fracture casted  Family History: Last updated: 12-05-07 Mother- deceased age 35; car accident;cancer Father- deceased age 34; care accident 46 Siblings  Social History: Last updated: 05-Dec-2007 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    Vital Signs:  Patient profile:   77 year old female Height:      62 inches Weight:      156 pounds BMI:     28.64 O2 Sat:      96 % on Room air Pulse rate:   74 / minute BP sitting:   120 / 78  (left arm) Cuff size:   regular  Vitals Entered By: Reynaldo Minium CMA (May 12, 2010 4:32 PM)  O2 Flow:  Room air CC: 1 month follow up visit-asthma; breathing better since last visit.   Physical Exam  Additional Exam:  General: A/Ox3; pleasant and cooperative, NAD, elderly woman, tired looking SKIN: no rash, lesions NODES: no lymphadenopathy HEENT: [place]/AT, EOM- WNL, Conjuctivae- clear, PERRLA, TM-WNL, Nose- clear, Throat- clear and wnl, Mallampati  II NECK: Supple w/ fair ROM, JVD- none, normal carotid impulses w/o bruits Thyroid-  CHEST: some basilar crackles c/w know chronic asthmatic bronchitis HEART: RRR, no m/g/r heard ABDOMEN:soft ZOX:WRUE, nl pulses, no edema  NEURO: Grossly intact to observation        CXR  Procedure date:  03/25/2010  Findings:      CHEST - 2 VIEW   Comparison: 07/02/2009   Findings: Heart size is upper limits normal.  There are perihilar bronchitic changes.  No focal consolidations or pleural effusions are identified.  Surgical clips are seen within the right upper quadrant of the abdomen.  Degenerative changes are present in the spine.   IMPRESSION:   1.  Bronchitic changes. 2. No evidence for acute cardiopulmonary abnormality.   Read By:  Patterson Hammersmith,  M.D.     Released By:  Patterson Hammersmith,  M.D.   Impression & Recommendations:  Problem # 1:  ASTHMA (ICD-493.90) She and daughter agree she is mugh better and seems to be well, compared with her last visit. We will continue maintenance Dulera and as needed use of  her neb and/ or Proair. She hasn't needed the perles for cough. She declines flu vax whidh makes her sick.   Problem # 2:  DYSPNEA/SHORTNESS OF BREATH (ICD-786.09) If she could increase her daily exercise level by walking more and losing a bit of weight, that would probably do more for her than additional mediciation. Her pulmonary status is near her goal and I don't find cardiac limitiation or anemia.  Her updated medication list for this problem includes:    Albuterol Sulfate (2.5 Mg/13ml) 0.083% Nebu (Albuterol sulfate) .Marland Kitchen... 1 vial in nebulizer up to 4 times daily as needed    Dulera 100-5 Mcg/act Aero (Mometasone furo-formoterol fum) .Marland Kitchen... 2 puffs and rinse, twice daily    Proair Hfa 108 (90 Base) Mcg/act Aers (Albuterol sulfate) .Marland Kitchen... 2 puffs four times a day as needed  Other Orders: Est. Patient Level III (45409)  Patient Instructions: 1)  Please schedule a follow-up appointment in 6 months. 2)  Try walking more for endurance and weight loss.      CXR  Procedure date:  03/25/2010  Findings:      CHEST - 2 VIEW   Comparison: 07/02/2009   Findings: Heart  size is upper limits normal.  There are perihilar bronchitic changes.  No focal consolidations or pleural effusions are identified.  Surgical clips are seen within the right upper quadrant of the abdomen.  Degenerative changes are present in the spine.   IMPRESSION:   1.  Bronchitic changes. 2. No evidence for acute cardiopulmonary abnormality.   Read By:  Patterson Hammersmith,  M.D.     Released By:  Patterson Hammersmith,  M.D.

## 2010-07-05 NOTE — Progress Notes (Signed)
Summary: requesting appt  Phone Note Call from Patient Call back at Home Phone [phone]   Caller: Patient Call For: young Reason for Call: Talk to Nurse Summary of Call: Patient requesting to speak to Katie.  Patient states she is having trouble with her asthma and is sob.  Requesting to see Dr. [name]. Initial call taken by: Lehman Prom,  April 15, 2010 9:14 AM  Follow-up for Phone Call        Pt wanted to check to see if there had been a cancelation on a thursday so she could see CY sooner.  There was a cancelation on 05-12-10 at 4pm so pt r/s to this. I asked pt if she was feeling sick and if I need to send a message to CY. Pt states no that is not necessary at this time. Carron Curie CMA  April 15, 2010 10:07 AM

## 2010-07-05 NOTE — Progress Notes (Signed)
Summary: breathing problem  Phone Note Call from Patient   Caller: Patient Call For: young Summary of Call: breathing problem would like cortisone called [facility] Initial call taken by: Rickard Patience,  October 01, 2009 10:45 AM  Follow-up for Phone Call        Spoke with pt.  She c/o increased SOB- with or without exertion x several wks.  She states that she has had wheezing for the past several nights.  Pt is requesting prednisone be called in.  Please advise, thanks! Vernie Murders  October 01, 2009 11:00 AM   Additional Follow-up for Phone Call Additional follow up Details #1::        ok Prednisone 10 mg, # 20 1 tab four times daily x 2 days, 3 times daily x 2 days, 2 times daily x 2 days, 1 time daily x 2 days  Additional Follow-up by: Waymon Budge MD,  October 01, 2009 1:33 PM    Additional Follow-up for Phone Call Additional follow up Details #2::    Rx was sent to pharm and I called to notify pt. Follow-up by: Vernie Murders,  October 01, 2009 1:45 PM  New/Updated Medications: PREDNISONE 10 MG TABS (PREDNISONE) 1 four times a day x 2 days, 1 three times a day x 2 days, 1 two times a day x 2 days, then 1 x 2 days, then stop Prescriptions: PREDNISONE 10 MG TABS (PREDNISONE) 1 four times a day x 2 days, 1 three times a day x 2 days, 1 two times a day x 2 days, then 1 x 2 days, then stop  #20 x 0   Entered by:   Vernie Murders   Authorized by:   Waymon Budge MD   Signed by:   Vernie Murders on 10/01/2009   Method used:   Electronically to        [facility]* (retail)       [address]       [place], [place]  [address]       Ph: [phone]       Fax: [phone]   RxID:   [id number]

## 2010-07-05 NOTE — Progress Notes (Signed)
Summary: FYI  Phone Note Call from Patient   Caller: Patient Call For: Rance Smithson Summary of Call: pt just want to thank dr Tykia Mellone for calling in her prescript yesterday . she feel much better. Initial call taken by: Rickard Patience,  March 22, 2010 9:15 AM  Follow-up for Phone Call        will forward message to CY as an FYI.  Aundra Millet Reynolds LPN  March 22, 2010 10:06 AM   Additional Follow-up for Phone Call Additional follow up Details #1::        Noted Additional Follow-up by: Waymon Budge MD,  March 22, 2010 1:05 PM

## 2010-07-05 NOTE — Progress Notes (Signed)
Summary: speak to [name]  Phone Note Call from Patient   Caller: Patient Call For: [facility] Summary of Call: pt requests to speak to [name] "just for a moment". just called to see how tara was doing.  Initial call taken by: Tivis Ringer, CNA,  January 19, 2010 9:59 AM  Follow-up for Phone Call        Called pt and she stated that she just hasn't seen me when she has been in to see Dr. [name] and was thinking about [place]. She wanted to make sure we had her new change of address. Advised pt that when she came in to see Dr. [name] next to have his nurse come and get me and I would come and see her. Pt stated she would. [name]  January 19, 2010 2:17 PM

## 2010-07-05 NOTE — Letter (Signed)
Summary: External Correspondence  External Correspondence   Imported By: Valinda Hoar 03/03/2010 14:28:32  _____________________________________________________________________  External Attachment:    Type:   Image     Comment:   External Document

## 2010-07-05 NOTE — Letter (Signed)
Summary: CMN for Oxygen/[facility]  CMN for Oxygen/[facility]   Imported By: Lanelle Bal 07/29/2009 09:02:48  _____________________________________________________________________  External Attachment:    Type:   Image     Comment:   External Document

## 2010-07-05 NOTE — Progress Notes (Signed)
Summary: rx  Phone Note Call from Patient Call back at Home Phone [phone]   Caller: Patient Call For: young Reason for Call: Talk to Nurse Summary of Call: pt has asthma flare up - Wants some prednisone called in. [facility] Initial call taken by: Eugene Gavia,  July 14, 2009 10:59 AM  Follow-up for Phone Call        Spoke with pt;Asthma flare up; wheezing and SOB; requests Predinsone RX to help with this. Pt sounded hoarse on the phone. Please advise. Reynaldo Minium CMA  July 14, 2009 11:03 AM   Additional Follow-up for Phone Call Additional follow up Details #1::        Per CDY-ok to give Pred taper 10mg  #20 take 4 once daily x 2days, 3 once daily x 2 days, 2 once daily x 2 days, 1 once daily x 2 days then stop.Reynaldo Minium CMA  July 14, 2009 11:41 AM    Pt aware that rx sent to pharmacy and to call office if no better.Reynaldo Minium CMA  July 14, 2009 11:43 AM     New/Updated Medications: PREDNISONE 10 MG TABS (PREDNISONE) take 4 once daily x 2 days, 3 once daily x 2 days, 2 once daily x 2 days, 1 once daily x 2 days then stop. Prescriptions: PREDNISONE 10 MG TABS (PREDNISONE) take 4 once daily x 2 days, 3 once daily x 2 days, 2 once daily x 2 days, 1 once daily x 2 days then stop.  #20tablets x 0   Entered by:   Reynaldo Minium CMA   Authorized by:   Waymon Budge MD   Signed by:   Reynaldo Minium CMA on 07/14/2009   Method used:   Electronically to        [facility]* (retail)       [address]       [place], [place]  [address]       Ph: [phone]       Fax: [phone]   RxID:   [id number]
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
## 2010-07-05 NOTE — Progress Notes (Signed)
Summary: Order to D/C Oxygen  Phone Note Call from Patient Call back at [phone]   Caller: Daughter: Johnny Bridge Summary of Call: Message left on VM: Patient would like to D/C Oxygen, patient states that she feels she doesnt need it and it is really irritating her. Patient followed instruction from [facility] office and still no relief with irritation of nose. Please fax order to D/C oxygen to [facility] .   I called and spoke with Johnny Bridge and informed her that I will futher discuss with Dr.Hopper and contact her back with his indications./[facility]  June 24, 2009 1:35 PM   Follow-up for Phone Call        Per Dr.Hopper patient needs Oxygen, he can not send order to D/C because its obvious that patient needs it. If patient D/C Oxygen it will be at her own will and she will be at an increased cardiac risk.   I spoke with Johnny Bridge and she ok'd information and she said she will pass it along to her mother Follow-up by: Shonna Chock,  June 24, 2009 1:54 PM

## 2010-07-05 NOTE — Miscellaneous (Signed)
Summary: Home Care Report//order for assessment and initiation of care  Home Care Report//order for assessment and initiation of care   Imported By: Job Founds 03/25/2007 09:29:39  _____________________________________________________________________  External Attachment:    Type:   Image     Comment:   External Document

## 2010-07-05 NOTE — Progress Notes (Signed)
Summary: x-ray results  Phone Note Call from Patient Call back at Home Phone [phone]   Caller: Patient Call For: young Summary of Call: Wants CXR results. Initial call taken by: Darletta Moll,  August 20, 2009 1:47 PM  Follow-up for Phone Call        called, spoke with pt.  Pt requesting cxr results but could not remember when she had the cxr done.  I advised pt that the last cxr in our system was from Jul 02, 2009 and advise her of results as stated in cxr append per CY - she verbalized understanding and had no further questions.   Follow-up by: Gweneth Dimitri RN,  August 20, 2009 1:54 PM

## 2010-07-05 NOTE — Progress Notes (Signed)
Summary: COUGH and increased sob  Phone Note Call from Patient   Caller: Patient Call For: YOUNG Summary of Call: PT WOULD LIKE PREDNISONE FOR COUGH OR WHATEVER HE THINK IS [facility] [phone] Initial call taken by: Rickard Patience,  March 21, 2010 4:09 PM  Follow-up for Phone Call        called and spoke with pt. pt states Sx started last Thursday 03/17/2010.  Pt c/o increased cough with pale gray sputum.  Pt also c/o increased sob with exertion and at rest, hoarseness and "very tight" in chest.  Pt denied fever.  Will forward message to Cy to address.  Aundra Millet Reynolds LPN  March 21, 2010 4:18 PM  Allergies (verified):  1)  ! Morphine 2)  ! * Dilaudid 3)  Codeine 4)  Tussionex Pennkinetic Er (Chlorpheniramine-Hydrocodone) 5)  Hydrocodone  Additional Follow-up for Phone Call Additional follow up Details #1::        Ok to send prednisone 10 mg, # 20 1 tab four times daily x 2 days, 3 times daily x 2 days, 2 times daily x 2 days, 1 time daily x 2 days  Additional Follow-up by: Waymon Budge MD,  March 21, 2010 5:30 PM    Additional Follow-up for Phone Call Additional follow up Details #2::    rx sent. pt aware.Carron Curie CMA  March 21, 2010 5:32 PM   New/Updated Medications: PREDNISONE 10 MG TABS (PREDNISONE) 1 tab four times daily x 2 days, 3 times daily x 2 days, 2 times daily x 2 days, 1 time daily x 2 days Prescriptions: PREDNISONE 10 MG TABS (PREDNISONE) 1 tab four times daily x 2 days, 3 times daily x 2 days, 2 times daily x 2 days, 1 time daily x 2 days  #20 x 0   Entered by:   Carron Curie CMA   Authorized by:   Waymon Budge MD   Signed by:   Carron Curie CMA on 03/21/2010   Method used:   Electronically to        [facility]* (retail)       [address]       [place], [place]  [address]       Ph: [phone]       Fax: [phone]   RxID:   [id number]

## 2010-08-08 ENCOUNTER — Ambulatory Visit: Payer: Self-pay | Admitting: Internal Medicine

## 2010-09-07 ENCOUNTER — Other Ambulatory Visit: Payer: Self-pay | Admitting: Internal Medicine

## 2010-09-07 ENCOUNTER — Telehealth: Payer: Self-pay | Admitting: Internal Medicine

## 2010-09-07 MED ORDER — PREDNISONE 10 MG PO TABS: ORAL_TABLET | ORAL | Status: DC

## 2010-09-07 NOTE — Telephone Encounter (Signed)
Called and advised pt rx was sent to pharmacy as well as directions for rx. Pt verbalized understanding

## 2010-09-07 NOTE — Telephone Encounter (Signed)
Per CDY-okay to give Prednisone 10mg  #20 take 4 x 2 days, 3 x 2 days, 2 x 2 days, 1 x 2 days, then stop no refills.[name]

## 2010-09-07 NOTE — Telephone Encounter (Signed)
Spoke with pt and she is requesting to have prednisone called in. Pt c/o increased SOB and having a hard time catching her breath, cough w/ occas clear phlem, chest tightness, wheezing x 2 days. Pt states her cough is keeping her up all night. Pt was last seen 05/12/10 and next OV is 11/09/10. Please advise Dr. [name]. Thanks

## 2010-09-15 NOTE — Telephone Encounter (Signed)
OK to send prednisone taper.  

## 2010-09-15 NOTE — Telephone Encounter (Signed)
Please advise if okay to send pred taper as requested. Thanks.

## 2010-09-26 ENCOUNTER — Telehealth: Payer: Self-pay | Admitting: Critical Care Medicine

## 2010-09-26 NOTE — Telephone Encounter (Signed)
Error patient needed appt with dr young.Vedia Coffer

## 2010-09-27 ENCOUNTER — Ambulatory Visit (INDEPENDENT_AMBULATORY_CARE_PROVIDER_SITE_OTHER)
Admission: RE | Admit: 2010-09-27 | Discharge: 2010-09-27 | Disposition: A | Discharge status: Home / Self Care | Payer: Medicare Other | Source: Ambulatory Visit | Attending: Internal Medicine | Admitting: Internal Medicine

## 2010-09-27 ENCOUNTER — Ambulatory Visit (INDEPENDENT_AMBULATORY_CARE_PROVIDER_SITE_OTHER): Payer: Medicare Other | Admitting: Internal Medicine

## 2010-09-27 ENCOUNTER — Encounter: Payer: Self-pay | Admitting: Internal Medicine

## 2010-09-27 ENCOUNTER — Other Ambulatory Visit (INDEPENDENT_AMBULATORY_CARE_PROVIDER_SITE_OTHER): Payer: Medicare Other

## 2010-09-27 VITALS — BP 122/74 | HR 73 | Ht 62.0 in | Wt 164.2 lb

## 2010-09-27 DIAGNOSIS — J45909 Unspecified asthma, uncomplicated: Secondary | ICD-10-CM

## 2010-09-27 DIAGNOSIS — R0989 Other specified symptoms and signs involving the circulatory and respiratory systems: Secondary | ICD-10-CM

## 2010-09-27 DIAGNOSIS — R0609 Other forms of dyspnea: Secondary | ICD-10-CM

## 2010-09-27 LAB — BASIC METABOLIC PANEL
CO2: 31 mEq/L (ref 19–32)
Calcium: 10.1 mg/dL (ref 8.4–10.5)
Chloride: 107 mEq/L (ref 96–112)
Glucose, Bld: 99 mg/dL (ref 70–99)
Potassium: 5.2 mEq/L — ABNORMAL HIGH (ref 3.5–5.1)
Sodium: 144 mEq/L (ref 135–145)

## 2010-09-27 MED ORDER — BUDESONIDE 0.25 MG/2ML IN SUSP
0.2500 mg | Freq: Two times a day (BID) | RESPIRATORY_TRACT | Status: DC
Start: 2010-09-27 — End: 2011-09-04

## 2010-09-27 MED ORDER — FUROSEMIDE 20 MG PO TABS: ORAL_TABLET | ORAL | Status: DC

## 2010-09-27 NOTE — Progress Notes (Signed)
  Subjective:    Patient ID: [name], female    DOB: [birth year], 77 y.o.   MRN: [id number]  HPI 31 yoFnever smoker, here with daughter(pharmacist) , followed for asthma and dyspnea with complicating co-morbidities reviewed. Last here 05/13/11. Since then in last few weeks has been using neb up to twice daily. If used too late it will keep her up, but not noting palpitation. Cough more productive of white sputum. Twinges of pain around chest, but no anginal pains. Some wheeze. Feet swell.    Review of Systems See HPOI Constitutional:   No weight loss, night sweats,  Fevers, chills, fatigue, lassitude. HEENT:   No headaches,  Difficulty swallowing,  Tooth/dental problems,  Sore throat,                No sneezing, itching, ear ache, nasal congestion, post nasal drip,   CV: Orthopnea, PND, swelling in lower extremities, anasarca, dizziness, palpitations  GI  No heartburn, indigestion, abdominal pain, nausea, vomiting, diarrhea, change in bowel habits, loss of appetite  Resp:.   excess mucus ,productive cough,non-productive cough,  No coughing up of blood.  No change in color of mucus.  No wheezing.   Skin: no rash or lesions.  GU: no dysuria, change in color of urine, no urgency or frequency.  No flank pain.  MS:  No joint pain or swelling.  No decreased range of motion.  No back pain.  Psych:  No change in mood or affect. No depression or anxiety.  No memory loss.      Objective:   Physical Exam General- Alert, Oriented, Affect-appropriate, Distress- none acute    Using a walker today  Skin- rash-none, lesions- none, excoriation- none  Lymphadenopathy- none  Head- atraumatic  Eyes- Gross vision intact, PERRLA, conjunctivae clear secretions  Ears- OK- Hearing, canals, Tm L , R ,  Nose- Clear, No-Septal dev, mucus, polyps, erosion, perforation   Throat- Mallampati II , mucosa clear , drainage- none, tonsils- atrophic  Neck- flexible , trachea midline, no stridor ,  thyroid nl, carotid no bruit  Chest - symmetrical excursion , unlabored     Heart/CV- RRR , no murmur , no gallop  , no rub, nl s1 s2                     - JVD- none , edema- 2+, stasis changes- none, varices- none     Lung- Bilateral mild rhonchi, no rales or wheeze, cough- none , dullness-none, rub- none     Chest wall- Abd- tender-no, distended-no, bowel sounds-present, HSM- no  Br/ Gen/ Rectal- Not done, not indicated  Extrem- cyanosis- none, clubbing, none, atrophy- none, strength- nl  Neuro- grossly intact to observation         Assessment & Plan:

## 2010-09-27 NOTE — Assessment & Plan Note (Addendum)
Increased dyspnea and cough. They asked for a nebulized maintenance med. We discussed Brovana, but I will try budesonide. With ankle edema, I suspect fluid retention as the basis for increased dyspnea and will get CXR and BMET with BNP. Doubt PE ( Homan's neg)

## 2010-09-27 NOTE — Patient Instructions (Signed)
Added Budesonide nebulizer solution- script sent  For now continue Advair twice daily. If you get to doing very well, then yo can try without it.  Added lasix for use as a diuretic for fluid retention when needed.- script swent  Orders- CXR for dx asthma               BMET               BNP

## 2010-10-04 ENCOUNTER — Telehealth: Payer: Self-pay | Admitting: Internal Medicine

## 2010-10-04 NOTE — Telephone Encounter (Signed)
Spoke w/ pt and advised her of her lab and cxr results. Pt states she also wants [place] to speak w/ her daughter. Pt states her daughter should return in an hour. Ia dvised pt to have her call our office when she arrives. Pt states she will. Will await daughters call back  Blood tests are ok. Potassium is just a little high. She could skip her potassium supplement for now, till her next doctor's visit can check it again.   CXR- COPD and some scarring- nothing new or active. I think daughter is the contact person.

## 2010-10-06 NOTE — Progress Notes (Signed)
Quick Note:  Left Message for Daughter(Martha) regarding results and if any questions to call me at the office. ______

## 2010-10-06 NOTE — Progress Notes (Signed)
Quick Note:  Left Message for Daughter(Martha) regarding results and if any questions to call me at the office. ______ 

## 2010-10-07 NOTE — Telephone Encounter (Signed)
Called and spoke with pt.  She states that daughter is busy today b/c her son is having surgery.  She suggests that we try to call her daughter back next wk at this number.  Will call on 10/10/10.

## 2010-10-12 NOTE — Telephone Encounter (Signed)
Spoke w/ pt and she states daughter was not in and is busy and states that she had already spoken w/ someone w/ our office states they advised her they would mail results to pt home. I advised pt it doesn't show anyone was mailing results and I advised her I would mail them just in case they did not get mailed. Nothing further was needed

## 2010-10-18 NOTE — H&P (Signed)
NAMEHEDWIG, MCFALL NO.:  [id number]   MEDICAL RECORD NO.:  [id number]          PATIENT TYPE:  INP   LOCATION:  2012                         FACILITY:  [facility]   PHYSICIAN:  Rod Holler, MD     DATE OF BIRTH:  04/12/1919   DATE OF ADMISSION:  06/09/2007  DATE OF DISCHARGE:                              HISTORY & PHYSICAL   CHIEF COMPLAINTS:  Chest pain.   HISTORY:  Ms. Panozzo is an 77 year old female with a history of MI in 2003  that had cardiac catheterization with nonobstructive coronary artery  disease who is currently in a rehabilitation facility for partial hip  replacement who presented to the emergency department for chest pain.  Tonight after a trip to the bathroom the patient was very tired, than  had an episode of left-sided chest pain with radiation to her left upper  extremity that she described as sharp.  There was no associated  shortness of breath, nausea, diaphoresis.  The patient took two  sublingual nitroglycerin.  Currently, she is chest pain-free.  She has  had no recent PND or orthopnea, no lower extremity swelling, no syncope  or presyncope, no palpitations.  In the emergency department the patient  was given 20 mEq of potassium chloride p.o. and 10 mEq IV.   PAST MEDICAL HISTORY:  1. Hip fracture, status post partial hip replacement.  2. Hypertension.  3. Dementia.  4. Parkinson's.  5. Asthma.  6. Cardiac catheterization 2003 due to MI with nonobstructive coronary      artery disease.   MEDICINES:  1. Norvasc 5 mg p.o. daily.  2. Aspirin 81 mg p.o. daily.  3. Protonix 40 mg p.o. daily.  4. Advair 10/50 daily.  5. Razadyne 8 mg p.o. b.i.d.  6. Sinemet 25/100 one tab p.o. t.i.d.  7. Tylenol p.r.n.  8. Darvocet-N 100 one tab q.6 h p.r.n.  9. Albuterol nebulizer p.r.n.   ALLERGIES:  1. CODEINE.  2. DILAUDID.  3. HYDROCODONE.  4. HYDROMORPHONE.  5. MORPHINE.  6. TUSSIONEX.   SOCIAL HISTORY:  The patient currently is in a  rehabilitation facility,  does not smoke.   FAMILY HISTORY:  Noncontributory.   REVIEW OF SYSTEMS:  All systems reviewed in detail are negative except  as noted in history of present illness.   PRESENT ILLNESS:  VITAL SIGNS:  Blood pressure initially 161/73,  subsequent blood pressure 147/67, heart rate 68, respiratory rate 18,  oxygen saturation 99%.  Temperature 97.0.  GENERAL:  Elderly female, alert and oriented x3, in no apparent  distress.  HEENT:  Atraumatic, normocephalic.  Pupils equal, round and react to  light.  Extraocular movements intact.  NECK:  Supple.  No adenopathy, no JVD.  CHEST:  Bibasilar crackles.  CORONARY:  Regular rhythm, normal rate, normal S1-S2, 2/6 systolic  ejection murmur.  ABDOMEN:  Soft, nontender, nondistended.  Active bowel sounds.  EXTREMITIES:  No clubbing, cyanosis or edema.   EKG shows a normal sinus rhythm with a left bundle branch block  unchanged from previous.   LAB:  Sodium 142, potassium  2.5, chloride 104, bicarb 28, BUN 11,  creatinine 0.9, glucose 115.  White blood cell count 3.4, hematocrit  30.5.  Glucose 153, troponin less than 0.05, myoglobin 73, CK-MB less  than 1.   IMPRESSION AND PLAN:  An 77 year old female who presents with chest  pain.   PLAN:  1. Cardiovascular.  Admit the patient to a telemetry bed, rule out      with serial cardiac enzymes, continue home dose aspirin and      Norvasc, daily EKG.  2. Pulmonary.  Follow up D-dimer, home cardiovascular medicines.  3. Hematologic.  Check INR given that Coumadin was just stopped 2 days      ago.  4. Fluid, electrolytes and  nutrition.  Cardiac diet, replete      potassium.  5. GI.  Home dose of proton pump inhibitor.      Rod Holler, MD  Electronically Signed     TRK/MEDQ  D:  06/09/2007  T:  06/09/2007  Job:  [id number]

## 2010-10-18 NOTE — Assessment & Plan Note (Signed)
[facility]                             PULMONARY OFFICE NOTE   NAME:Mcaffee, [name]                         MRN:          [id number]  DATE:02/21/2007                            DOB:          03/01/1919    PROBLEM:  1. Asthma.  2. Esophageal reflux.  3. Left bundle branch block.  4. Coronary disease/infarction.   HISTORY:  Complains really through the summer she has felt short of  breath, no energy, not clear if either Spiriva or Advair help.  Albuterol nebulizer does help her some, little change from one day to  the next, nothing purulent or bloody, no chest pain or palpitation.  Family is talking about moving her to a more supervised living facility.   MEDICATIONS:  1. Albuterol nebulizer.  2. Albuterol inhaler.  3. Norvasc 5 mg.  4. Sinemet 25/100 t.i.d.  5. Sertraline 50 mg.  6. Razadyne ER 16 mg.  7. Aspirin 81 mg.  8. Calcium with vitamin D.  9. Advair 250/50.  10.Spiriva once daily.   DRUG INTOLERANCES:  CODEINE, TUSSIONEX and HYDROCODONE all cause rash,  possible SINGULAIR.   OBJECTIVE:  Weight 125 pounds.  BP 110/68, pulse 74, room air saturation  97%.  NECK:  Neck veins distended 1 cm, no rales or crackles.  HEART:  Sounds regular without gallop or murmur.  Dry cough.  No edema.   IMPRESSION:  Bronchitis/chronic obstructive pulmonary disease, scarring,  uncertain component of fluid overload.   PLAN:  She can try stopping first Spiriva, then a week later stop Advair  as a way to assess what these are doing.  Restart as needed.  Schedule  return in 2 months.  Meanwhile, order PFT.     Clinton D. [name], MD, Tonny Bollman, FACP  Electronically Signed    CDY/MedQ  DD: 02/21/2007  DT: 02/22/2007  Job #: [id number]   cc:   Leanne Chang, M.D.

## 2010-10-18 NOTE — H&P (Signed)
[name], HOWSE NO.:  [id number]   MEDICAL RECORD NO.:  [id number]          PATIENT TYPE:  IPS   LOCATION:  4039                         FACILITY:  [facility]   PHYSICIAN:  Ranelle Oyster, M.D.DATE OF BIRTH:  1919/06/05   DATE OF ADMISSION:  05/10/2007  DATE OF DISCHARGE:                              HISTORY & PHYSICAL   CHIEF COMPLAINT:  Left hip pain.   HISTORY OF PRESENT ILLNESS:  This is an 77 year old, white female with  chronic asthma and Parkinson's disease who is admitted on December 1,  after a fall without loss of consciousness.  She had previously  fractured her right ankle in October.  The patient's left femoral neck  fracture  was repaired via monopolar hip hemiarthroplasty on December 2,  by Dr. Ophelia Charter.  She was placed on Coumadin for DVT prophylaxis.  She has  had fair pain control, but in the setting of her Parkinson's disease and  dyspnea, continues to struggle with functional gains.   REVIEW OF SYSTEMS:  Notable for occasional shortness of breath and cough  as well as tremor.  Sleep has been fair as well as appetite.  Other  pertinent positives are listed above and full reviews in the written  H&P.   PAST MEDICAL HISTORY:  1. Chronic asthma.  2. Depression.  3. Cholecystectomy.  4. Tonsillectomy.  5. Reflux disease.  6. Parkinson's disease.  7. Dementia.  8. Right ankle fracture in October 2008.   HABITS:  She denies alcohol and tobacco use.   FAMILY HISTORY:  Noncontributory.   SOCIAL HISTORY:  The patient lives at [facility].  She is a retired Engineer, civil (consulting).  She has a one level house  with no steps to enter.  Daughter in the area who can support her as  needed.  Daughter is a Building services engineer.   FUNCTIONAL HISTORY:  The patient is independent, but not driving prior  to arrival.  She uses a cane for balance.   ALLERGIES:  MORPHINE, HYDROCODONE, CODEINE, HYDROMORPHONE AND TUSSIONEX.   MEDICATIONS  AT HOME:  Albuterol inhaler, albuterol nebulizer, Norvasc,  Sinemet, Zoloft, Razadyne ER 16 mg daily, aspirin 81 mg daily, calcium  daily, Advair daily and Spiriva daily.   LABORATORY DATA AND X-RAY FINDINGS:  Hemoglobin 9.9, white count 5.3,  platelets 112,000.  Sodium 139, potassium 4.0.  BUN and creatinine 9 and  1.07.   PHYSICAL EXAMINATION:  VITAL SIGNS:  Blood pressure 98/55, pulse is 83,  respiratory rate 16, temperature 99.  GENERAL:  The patient is pleasant, alert and oriented x3.  HEENT:  Pupils equal, round reactive to light.  Ears, nose and throat  exam is unremarkable with fair dentition and pink, moist oral mucosa.  NECK:  Supple without JVD or lymphadenopathy.  CHEST:  Clear to auscultation bilaterally without wheezes rales or  rhonchi today.  HEART:  Regular rate and rhythm without murmur, rubs or gallops.  ABDOMEN:  Soft, nontender.  Bowel sounds are positive.  SKIN:  Generally intact except for left hip wound which was clean and  minimally draining.  Right ankle is slightly tender to palpation today  with mild surgical scar noted.  NEUROLOGIC:  Cranial nerves 2-12 are grossly intact.  Reflexes are 1+.  Sensation is grossly intact.  The patient had intentional tremor.  No  rigidity was seen.  Strength was generally 4+/5 to 5/5 throughout except  the left leg which was 2/5 proximal to 4/5 distal.  Judgment was  generally appropriate for basic information.  Memory was poor and she  often lost track at times while discussing particular topics in the  present and the past.  Mood was pleasant.   ASSESSMENT/PLAN:  1. Functional deficit secondary to left valgus femoral neck fracture,      status post left hip hemiarthroplasty on postop day #3.  The      patient also suffers from Parkinson's disease and severe asthma      which compromises her respiratory capacity.  Begin comprehensive      inpatient rehab with physical therapy to assess and treat for range      of motion,  strengthening, transfers and gait as well as safety.      Occupational therapy will assess and treat for range of motion,      strengthening, cognitive/perceptual training and equipment.  Rehab      nurse will follow on 24-hour basis for skin care, bowel, bladder,      safety and nutritional issues.  Rehab case manager/social worker      assess for psychosocial needs and discharge planning.  Estimated      length of stay is 10+ days.  Prognosis fair to good.  Goal      supervision.  2. Deep venous thrombosis prophylaxis with Coumadin and subcutaneous      Lovenox coverage until INR is greater than 2.0.  3. Hypertension.  Continue Norvasc.  4. Dementia.  Continue Razadyne.  5. Parkinson's disease:  Continue Sinemet.  6. Chronic asthma.  Albuterol handheld and albuterol inhaler p.r.n.  7. Anemia.  Continue Trinsicon.  8. Pain management.  Duragesic patch and Demerol as needed.      Ranelle Oyster, M.D.  Electronically Signed     ZTS/MEDQ  D:  05/10/2007  T:  05/11/2007  Job:  [id number]

## 2010-10-18 NOTE — Discharge Summary (Signed)
[name], [name]                  ACCOUNT NO.:  [id number]   MEDICAL RECORD NO.:  [id number]          PATIENT TYPE:  INP   LOCATION:  1615                         [facility]ACILITY:  [facility]   PHYSICIAN:  Mark C. Ophelia Charter, M.D.    DATE OF BIRTH:  1918/07/13   DATE OF ADMISSION:  05/06/2007  DATE OF DISCHARGE:  05/09/2006                               DISCHARGE SUMMARY   FINAL DIAGNOSES:  Left femoral neck fracture.   ADDITIONAL DIAGNOSES:  1. Previous right ankle open reduction and internal fixation, Dr.      August Saucer, 2008.  2. Asthma with chronic obstructive pulmonary disease.  3. Coronary artery disease, followed by Dr. Peter Swaziland.  4. Hypokalemia.   OPERATIONS AND PROCEDURES:  Left hip hemiarthroplasty on May 07, 2007.   CONSULTATIONS:  Pulmonary consultation with Dr. Jetty Duhamel.   This 77 year old female lives at assisted living, has chronic asthma,  coronary artery disease, status post ___orif ankle by Dr. Dean___ in the  past, and has had a history of some falls.  She has had a right ankle  fracture ORIF, treated by Dr. August Saucer, successfully healed from this, was  ambulatory and then fell with left-hip fracture.  Fracture of the  femoral neck in valgus and slightly displaced.   ADMISSION LABORATORY DATA:  Included urinalysis, which showed few  bacteria, 0-2 WBC, and nitrite negative.  Potassium on admission was  3.3.  creatinine was 1.2, BUN was 17.  PT and PTT were normal.  Hemoglobin was 12.9.   HOSPITAL COURSE:  Patient was admitted and, after obtaining informed  consent, was taken to the operating room, and underwent left-hip Press-  Fit hemiarthroplasty on May 07, 2007.  Postoperatively, hemoglobin  decreased to 10.4.  She was on Coumadin pharmacy anti-DVT prophylaxis.  Pain control was somewhat of a problem, since the patient is allergic to  codeine, Vicodin, morphine and Dilaudid, causing itching.  She was given  Mepergan Forte orally and also some Demerol IV.  Her  admission  medications, albuterol inhaler, p.r.n. albuterol nebulizer daily,  Norvasc 5 mg daily, Sinemet 25/100 mg p.o. t.i.d., Razadyne ER 16 mg  p.o. daily, calcium with vitamin D and 81 mg one aspirin a day were all  continued.  Patient received perioperative antibiotic prophylaxis.  She  was seen by Dr. [name] preoperatively, who recommended continuing her  medications.  Chest x-ray was clear, other than COPD changes.  Patient  was mobilized with physical therapy, evaluated by rehab, felt to be a  good candidate with her history of falls and was taking good p.o. and  was ready for transfer on May 10, 2007.  On May 09, 2007, she  was started on a Duragesic patch to see if this would help her pain  since she was having to take the Demerol orally on a very regular  schedule seemed to not always be controlling her pain.  She was taking  Coumadin 5 mg a day initially and 5 mg for the second dose and pro-times  were being monitored, but have not changed yet.  Staples need  to be  removed from her hip on two weeks postoperative time-span.  She is full  weightbearing as tolerated and x-rays of her hip show excellent position  and alignment.  She has good family support with daughters and grand-  daughters.  Office followup with Dr. Ophelia Charter will be four to six weeks  postoperatively.  She is following hip precautions for posterior hip  operation and has a knee immobilizer while she is in bed until she is  ambulating well.   CONDITION ON TRANSFER:  Improved.   FINAL DIAGNOSES:  Left femoral neck fracture with left hip  hemiarthroplasty.      Mark C. Ophelia Charter, M.D.  Electronically Signed     MCY/MEDQ  D:  05/09/2007  T:  05/09/2007  Job:  [id number]

## 2010-10-18 NOTE — Discharge Summary (Signed)
[name], [name] NO.:  [id number]   MEDICAL RECORD NO.:  [id number]          PATIENT TYPE:  IPS   LOCATION:  4039                         FACILITY:  [facility]   PHYSICIAN:  Ranelle Oyster, M.D.DATE OF BIRTH:  04/09/1919   DATE OF ADMISSION:  05/10/2007  DATE OF DISCHARGE:  05/21/2007                               DISCHARGE SUMMARY   DISCHARGE DIAGNOSES:  1. Left valgus femoral neck fracture status post left hip      hemiarthroplasty May 07, 2007.  2. Pain management.  3. Coumadin for deep vein thrombosis prophylaxis.  4. Hypertension.  5. Dementia.  6. Parkinson's disease.  7. Chronic asthma.  8. Anemia.   This 77 year old white female, history of chronic asthma with severe  exertional dyspnea, Parkinson's disease was admitted to [facility] May 06, 2007, after a fall without loss of consciousness.  Sustained a left valgus femoral neck fracture.  Underwent monopolar  hemiarthroplasty December 2 per Dr. Ophelia Charter.  Placed on Coumadin for deep  vein thrombosis prophylaxis, weightbearing as tolerated.  Pain control  with Duragesic patch and Demerol.  Advised posterior hip precautions.  Past medical history of chronic asthma, depression, tonsillectomy,  gastroesophageal reflux disease, dementia, Parkinson's disease.  She has  had a cholecystectomy and right ankle fracture.  Denies alcohol or  tobacco.   ALLERGIES:  1. MORPHINE SULFATE.  2. HYDROMORPHONE.  3. HYDROCODONE.  4. CODEINE.  5. TUSSIONEX.   SOCIAL HISTORY:  Lives at [facility] independent living.  She is  a retired Engineer, civil (consulting).  One-level home, no steps to entry.  Daughter in area  with good support.  Daughter is a Building services engineer.   MEDICATIONS PRIOR TO ADMISSION:  1. Albuterol inhaler.  2. Norvasc 5 mg daily.  3. Sinemet 25/100 three times daily.  4. Zoloft 50 mg daily.  5. Razadyne 16 mg.  6. Aspirin 81 mg daily.  7. Calcium.  8. Advair.  9. Spiriva.   REHABILITATION HOSPITAL COURSE:  The patient was admitted to inpatient  rehab services with therapies initiated on a 3-hour-daily basis  consisting of physical therapy, occupational therapy and rehabilitation  nursing.  The following issues were addressed during the patient's  rehabilitation stay.  Pertaining to Ms. Morrissette's left valgus femoral neck  fracture, she had undergone left hip hemiarthroplasty.  Surgical site  healing nicely.  She was followed by orthopedic services.  Posterior hip  precautions noted.  Pain control with medications simplified to Darvocet  due to some altered mental status related to her Demerol.  Blood  pressures remained well controlled on Norvasc with no orthostatic  changes.  She would remain on Razadyne for history of dementia, felt to  be at baseline per her daughter.  She remained on Sinemet three times  daily for Parkinson's disease.  Chronic asthma, oxygen saturations were  within normal limits.  She was using albuterol inhaler 2.5 mg daily.  Overall, for her functional status she was minimal assist for  ambulating, moderate assist for bed mobility, minimal assist basic  transfers, supervision upper body dressing, minimal assist lower  body.  She was continent of bowel and bladder.   DISCHARGE MEDICATIONS AT TIME OF DICTATION:  1. Coumadin with latest dose of 4 mg adjusted accordingly to be      completed on June 07, 2007.  2. Razadyne 8 mg p.o. b.i.d.  3. Norvasc 5 mg p.o. daily.  4. Aspirin 81 mg p.o. daily.  5. Sinemet 25/100 mg one tablet p.o. three times daily at 0800, 1400,      and 2000 hours.  6. Protonix 40 mg p.o. daily.  7. Albuterol inhaler 2.5 mg daily.  8. Vitamin D 500 mg p.o. b.i.d.  9. Darvocet-N 100 one tablet p.o. every 6 hours as needed pain.  10.Tylenol 650 mg p.o. every 4 hours as needed pain or fever.   DIET:  Regular.   SPECIAL INSTRUCTIONS:  Weightbearing as tolerated with posterior hip  precautions.  The patient should  follow up Dr. Ophelia Charter, orthopedic  services, call for appointment;  Dr. Blossom Hoops, medical management.      Mariam Dollar, P.A.      Ranelle Oyster, M.D.  Electronically Signed    DA/MEDQ  D:  05/20/2007  T:  05/21/2007  Job:  [id number]   cc:   Leanne Chang, M.D.  Vesta Mixer, M.D.  Clinton D. Young, MD, FCCP, FACP  Mark C. Ophelia Charter, M.D.

## 2010-10-18 NOTE — Consult Note (Signed)
[name], [name]                  ACCOUNT NO.:  [id number]   MEDICAL RECORD NO.:  [id number]          PATIENT TYPE:  INP   LOCATION:  1615                         FACILITY:  [facility]   PHYSICIAN:  Clinton D. [name], MD, FCCP, FACPDATE OF BIRTH:  Apr 10[birth year] 1921   DATE OF CONSULTATION:  05/07/2007  DATE OF DISCHARGE:                                 CONSULTATION   PROBLEM:  Pulmonary consultation at the kind request of Dr. Ophelia Charter for  this 77 year old woman needing preop evaluation before surgery for hip  fracture.   HISTORY:  She is a nonsmoker with a long history of chronic asthma with  severe exertional dyspnea.  She was most recently seen in the office in  September complaining of a nonspecific sense of exertional dyspnea with  no sudden events or real change.  She could not tell that either Advair  or Spiriva had helped and she saw no change when those were  discontinued.  An albuterol nebulizer machine does help, used two or  three times a day and an albuterol metered inhaler has also seemed to  help but she does not describe exacerbations of wheezing or coughing as  much as just of persistent sense of primarily exertional dyspnea.  Cough  at times is productive of small amounts of white or sometimes slightly  brown sputum.  She had a flu shot this fall and has had no recent acute  respiratory exacerbations.  Medications as listed at this office are  home nebulizer with albuterol four times daily as needed for, diazepam 5  mg twice daily as needed for anxiety, Nexium 40 mg, Norvasc 5 mg,  Sinemet 25/100 three times daily, sertraline 50 mg, Razadyne ER 16 mg,  aspirin 81 mg, calcium with vitamin D twice daily. Advair was  discontinued, Spiriva was discontinued.  Occasional Benadryl.  She has  nitroglycerin available.   DRUG INTOLERANCE:  CODEINE, TUSSIONEX AND HYDROCODONE ALL OF WHICH CAUSE  RASH.  She is not sure about Singulair.   PRIMARY PHYSICIAN:  Dr. Albin Felling.   CARDIOLOGIST:  Dr. Peter Swaziland.   REVIEW OF SYSTEMS:  Several pounds of weight loss over the summer. no  fever or chills and no recent palpitation or chest pain, no adenopathy  or rash and ankles usually do not swell.  No recent GI or GU change.   PAST HISTORY:  Asthmatic bronchitis.  She has cancelled recent attempts  of pulmonary function testing.  Her last available from 2005 gave FEV-1  1300 (70% predicted), FEV-1:FVC ratio was 0.68 with little response to  bronchodilator.  Coronary artery disease with myocardial infarction,  left bundle branch block (Dr. Peter Swaziland).  ACE inhibitors caused  cough.  Esophageal reflux, pancreatitis, ankle fracture, depression  tonsillectomy, cholecystectomy.   SOCIAL HISTORY:  Nonsmoker, no alcohol.  Widowed.  She is a retired  Doctor, general practice.   FAMILY HISTORY:  Both parents died in motor vehicle accidents.  One  brother died of alcohol complications.   OBJECTIVE:  BP 99/53, pulse regular 60, temperature 97.5, saturation 98%  on 2 liters prongs noting  that her usual room air saturation is around  97% through our office on room air.  GENERAL APPEARANCE:  Tired but alert, oriented elderly woman.  SKIN:  No rash.  ADENOPATHY:  None at the supraclavicular or axillary areas.  HEENT:  Clear.  Oropharynx no stridor or thyromegaly.  No neck vein  distention.  CHEST:  Heart sounds regular.  No murmur or gallop heard.  Lung fields  are quiet and clear anteriorly.  BREASTS:  Not examined.  ABDOMEN:  Soft, nontender without organomegaly, mass or bruits felt.  PELVIC/RECTAL:  Not examined.  EXTREMITIES:  No cyanosis, clubbing or edema.   LABORATORY:  Chest x-ray report is not available from this admission.  A  chest x-ray on February 2008 showed changes of chronic bronchitis and  COPD with slightly enlarged heart size.  Current labs include a  potassium level of 3.3, noting that she is currently getting intravenous  potassium replacement,  creatinine 1.21, hemoglobin 12.9.  EKG shows  bundle branch block pattern.   IMPRESSION:  1. She has had mild asthma with a fixed COPD component which has been      clinically stable with no recent acute exacerbation.  Her pulmonary      function status is as good as it is likely to get and should permit      general anesthesia for surgical repair of her hip.  2. Coronary disease with history of infarction and left bundle branch      block.  3. Hypokalemia being addressed.   RECOMMENDATIONS:  We will look in on her.  Please call for specific  needs.      Clinton D. [name], MD, Tonny Bollman, FACP  Electronically Signed     CDY/MEDQ  D:  05/07/2007  T:  05/07/2007  Job:  [id number]   cc:   Veverly Fells. Ophelia Charter, M.D.  Fax: [phone]

## 2010-10-18 NOTE — Op Note (Signed)
[name], [name]                  ACCOUNT NO.:  [id number]   MEDICAL RECORD NO.:  [id number]          PATIENT TYPE:  INP   LOCATION:  1615                         [facility]ACILITY:  [facility]   PHYSICIAN:  Mark C. Ophelia Charter, M.D.    DATE OF BIRTH:  Jun 15, 1918   DATE OF PROCEDURE:  05/07/2007  DATE OF DISCHARGE:                               OPERATIVE REPORT   PREOPERATIVE DIAGNOSIS:  Left valgus femoral neck fracture.   POSTOPERATIVE DIAGNOSIS:  Left valgus femoral neck fracture.   PROCEDURE:  Left Press-Fit monopolar hemiarthroplasty.   SURGEON:  Annell Greening, M.D.   ANESTHESIA:  Spinal.   ESTIMATED BLOOD LOSS:  150 mL.   COMPONENTS USED:  DePuy [place] Press-Fit size 3 Basic stem, +0 neck  length, 47-mm fracture hip head hip ball.   PROCEDURE:  After induction of spinal anesthesia, the patient was  stabilized and then placed lateral on the operative table, stabilization  with Mark VII frame with careful padding and positioning.  Time-out  procedure was taken after prepping and draping with the usual hip  sheets, drapes, impervious stockinette and Coban.  After time-out  procedure, the patient had already received preoperative Ancef  prophylaxis.  Posterior approach was made.  Charnley retractor was  placed holding the gluteus maximus which was split in line with the  fibers.  Piriformis was tagged for later repair.  Posterior capsule was  opened.  Hematoma was evacuated.  Neck was cut one fingerbreadth from  the lesser trochanter, and hip was removed with a corkscrew.  Bone  fragments were removed from inside the acetabulum.  Sequential sizing of  the head was used with a 47 giving a good suction fit based on the size  of the hole as well as head measure instrument.  Sequential reaming  lateralization cookie cutter broaching up to #3 size was performed.  Prosthesis was inserted with appropriate anteversion, flush with the  calcar.  Trial was inserted +0 with excellent restoration of leg  length.  After dislocation with the bone hook, the collar was cleaned  meticulously, and the permanent prosthesis was popped on impacted and  then reinserted with finger protecting the sciatic nerve and no foreign  body inside the acetabulum.  Posterior capsule was then repaired  anatomically with #1 Tycron.  A #1 Vicryl was used in the gluteus  maximus fascia as well as tensor fascia.  Two-0 Vicryl in subcutaneous  tissue, skin staple closure, postoperative dressing and knee  immobilizer.  Instrument and needle count was correct.      Mark C. Ophelia Charter, M.D.  Electronically Signed     MCY/MEDQ  D:  05/07/2007  T:  05/08/2007  Job:  [id number]

## 2010-10-18 NOTE — Discharge Summary (Signed)
[name], [name] NO.:  [id number]   MEDICAL RECORD NO.:  [id number]          PATIENT TYPE:  INP   LOCATION:  2012                         FACILITY:  [facility]   PHYSICIAN:  Peter M. Swaziland, M.D.  DATE OF BIRTH:  03/03/1919   DATE OF ADMISSION:  06/09/2007  DATE OF DISCHARGE:  06/10/2007                               DISCHARGE SUMMARY   [name] is an 77 year old white female who was admitted for evaluation  of chest pain.  She has a history of asthma, hypertension and prior  myocardial infarction with cardiac catheterization to 2003 showing only  nonobstructive coronary disease.  She had undergone a hip fracture in  December with a subsequent partial hip replacement.  She was  subsequently rehabbing at the [facility] when she  experienced an episode of chest pain radiating to her left arm.  She  took two nitroglycerin and had subsequent relief of her pain, and she  was admitted for further evaluation.  For details of past medical  history, social history, family history and physical exam, please see  admission history and physical.   LABORATORY DATA:  ECG showed a left bundle branch block.  Chest x-ray  showed no active disease.  White count 3400, hemoglobin 10.4, hematocrit  30.5, platelets 153,000.  Sodium 142, potassium 2.5, chloride 104, CO2  28, BUN 11, creatinine 0.86, glucose of 115.  Serial cardiac enzymes  were negative x3.  D-dimer was 0.42 which was within normal limits.  Her  Protime was 36.2 with an INR of 3.5, despite the fact that her Coumadin  had been stopped two days prior.   HOSPITAL COURSE:  The patient was admitted to telemetry monitoring.  She  ruled out for myocardial infarction.  Her ECG remained stable.  She had  no subsequent chest pain.  Her potassium was repleted to a level of 3.4  at time of discharge.  It was felt that the patient was stable to resume  her rehab and that she required no further hospitalization.  I  would not  recommend further cardiac testing unless she had recurrent chest pain.  The patient was discharged home in stable condition back to the nursing  facility to continue her rehab.   DISCHARGE DIAGNOSES:  1. Chest pain, myocardial infarction ruled out.  2. Status post hip fracture with partial hip replacement.  3. Hypertension.  4. Dementia.  5. Parkinson's disease.  6. Asthma.   DISCHARGE MEDICATIONS:  Norvasc 5 mg daily, aspirin 81 mg per day,  Protonix 40 mg daily, Advair 100/50 1 puff b.i.d., Razadyne 8 mg b.i.d.,  Sinemet 25/100 1 tablet t.i.d., Darvocet N 100 p.r.n., albuterol p.r.n.  and potassium 40 mEq daily.           ______________________________  Peter M. Swaziland, M.D.     PMJ/MEDQ  D:  06/10/2007  T:  06/10/2007  Job:  [id number]   cc:   Anselmo Rod, M.D.  Clinton D. [name], MD, FCCP, FACP  __ Nanine Means, M.D.

## 2010-10-21 NOTE — Cardiovascular Report (Signed)
NAME:  [name], [name]                            ACCOUNT NO.:  [id number]   MEDICAL RECORD NO.:  [id number]                   PATIENT TYPE:  INP   LOCATION:  4736                                 FACILITY:  [facility]   PHYSICIAN:  Vesta Mixer, M.D.              DATE OF BIRTH:  1919-02-13   DATE OF PROCEDURE:  DATE OF DISCHARGE:                              CARDIAC CATHETERIZATION   INDICATIONS FOR PROCEDURE:  The patient is an 77 year old female with a  history of chest pain from the past.  She presented yesterday with episodes  of chest pain.  She presented yesterday with episodes of chest pain and  shortness of breath with radiation to the left arm.  The pain was not  relieved with Demerol but was eventually relieved with sublingual  nitroglycerin.  She presented to the emergency room and was admitted.  She  had mildly positive cardiac enzymes and was referred for heart  catheterization.   PROCEDURE:  Left heart catheterization with coronary angiography.   PROCEDURAL NOTE:  The right femoral artery was easily cannulated using the  modified Seldinger technique.   PRESSURES:  1. Left ventricular pressure was 159/21.  2. Aortic pressure was 146/74.   ANGIOGRAPHY:  1. The left main coronary artery was fairly smooth and normal.  2. The left anterior descending artery has a minor luminal irregularity in     the proximal segment between 20-25%.  The remainder of the LAD is normal.     There are several diagonal branches which are normal.  3. The left circumflex artery is fairly normal.  The distal obtuse marginal     artery is normal.  4. The right coronary artery is smooth and normal throughout its course.     The posterior descending artery and the posterolateral segment artery are     normal.  5. The left ventriculogram reveals anteroapical and inferoapical akinesis     consistent with a myocardial infarction.  There was no thrombus     formation.  There was no obvious mitral  regurgitation.   COMPLICATIONS:  None.   CONCLUSION:  Relatively smooth and fairly normal coronary arteries.  She has  minor luminal irregularities but no discrete stenosis.  The left  ventriculogram confirms the fact that she did have some myocardial  ischemia/small  myocardial infarction.  It appears that she had either coronary spasm or  perhaps had a spontaneous thrombosis that subsequently resolved.  We will  treat her with medical therapy.  She does not need percutaneous transluminal  coronary angioplasty or bypass grafting.                                               Vesta Mixer, M.D.    PJN/MEDQ  D:  06/04/2002  T:  06/04/2002  Job:  [id number]   cc:   Peter M. Swaziland, M.D.  [address], [address]  [place], [place] [address]  Fax: [phone]

## 2010-10-21 NOTE — Assessment & Plan Note (Signed)
[name] [name]                             PULMONARY OFFICE NOTE   NAME:[name] [name] OERTEL                         MRN:          [id number]  DATE:08/17/2006                            DOB:          Jan 27, 1919    PROBLEMS:  1. Asthma.  2. Esophageal reflux.  3. Left bundle branch block.  4. Coronary disease/infarction.   HISTORY:  I had last seen her in July 2006. She has moved her care  closer to where she lives now. She had a bronchitis episode treated with  a Z-pack and steroids, leading to a chest x-ray in February at  [facility]. This suggested some prominent markings or light  infiltrate in the right middle lobe and lingula. She still has some  cough everyday, but feels much better, maybe a little short of breath  sometimes with exertion. She likes Mucinex. Cough was more productive at  the beginning of the illness and substantially improved now with only  scant clear phlegm. There is no sweat or fever. She was concerned and  decided that she wanted to maintain contact here in case she got worse.   MEDICATIONS:  1. Home nebulizer with albuterol or albuterol metered inhaler up to      daily p.r.n.  2. Diazepam 5 mg b.i.d. p.r.n.  3. Occasional Nexium.  4. Norvasc 5 mg.  5. Sinemet 25/100 t.i.d.  6. Sertraline 50 mg.  7. Razadyne ER 16 mg.  8. Aspirin 81 mg.  9. Calcium with vitamin D.  10.Advair 250/50.  11.P.r.n. use of Benadryl, Mucinex, and Tylenol.  Drug intolerant of CODEINE, TUSSIONEX, and HYDROCODONE, all of which  give rash and possibly SINGULAIR.   OBJECTIVE:  Weight 152 pounds, blood pressure 122/60, pulse 82, room air  saturation 97%. Rather harsh cough with a deep breath. Few scattered  crackles. No dullness or rub. Heart sounds are regular without murmur  heard. I do not find adenopathy or edema.   IMPRESSION:  Asthmatic bronchitis/exacerbation of asthma with chronic  obstructive pulmonary disease. This is acting like a  slow resolution of  a viral bronchitis.   PLAN:  We will get the x-ray report done in February. She does not have  enough sputum to culture now. I have emphasized reflux precautions and  offered a sample trial of Spiriva once  daily. We are scheduling return in six months to maintain contact, but  she understands to call if this cough does not resolve, especially with  the Spiriva.     Clinton D. [name], MD, Tonny Bollman, FACP  Electronically Signed    CDY/MedQ  DD: 08/18/2006  DT: 08/20/2006  Job #: [id number]   cc:   Leanne Chang, M.D.

## 2010-10-21 NOTE — Discharge Summary (Signed)
NAME:  [name], [name]                            ACCOUNT NO.:  [id number]   MEDICAL RECORD NO.:  [id number]                   PATIENT TYPE:  INP   LOCATION:  4736                                 FACILITY:  [facility]   PHYSICIAN:  Vesta Mixer, M.D.              DATE OF BIRTH:  1918/08/01   DATE OF ADMISSION:  06/03/2002  DATE OF DISCHARGE:  06/05/2002                                 DISCHARGE SUMMARY   DISCHARGE DIAGNOSES:  1. Apical myocardial infarction secondary to either spasm or transient     thrombosis.  Subsequent cardiac catheterization revealed smooth and     normal coronary arteries.  2. Asthma.   DISCHARGE MEDICATIONS:  1. Altace 2.5 mg a day.  2. Imdur 30 mg a day.  3. Plavix 75 mg a day.  4. Enteric-coated aspirin 325 mg a day.  5. Albuterol inhaler and Pulmicort inhaler as needed.  6. Nexium as needed.  7. Valium as needed.   DISPOSITION:  The patient is to see Dr. Peter M. Swaziland in one week; she is  to call for an appointment.   DIET:  She is to eat a low-fat, low-salt and low-cholesterol diet.   HISTORY:  The patient is an 77 year old female who was admitted through the  emergency room with a significant episode of chest pain.  She had mildly  positive cardiac enzymes and was admitted.   HOSPITAL COURSE:  The day following her admission, she had a heart  catheterization.  It revealed minimal coronary artery irregularities.  She  had some slight irregularities involving the LAD but no significant  stenoses.  There was also no evidence of thrombosis.  The left  ventriculogram revealed a moderate-to-large area of akinesis involving the  anterior apical walls, apex and inferior apical walls.  There was no  thrombus within this area of akinesis.  The patient was started on Altace  for afterload reduction and was started on Imdur to help with any  possible coronary spasm; she was also placed on Plavix to prevent any  coronary thrombosis and the patient will be  discharged on the above-noted  medications and disposition.  She will need a followup echocardiogram in the  next month or so.  All of her other medical problems were stable.                                               Vesta Mixer, M.D.    PJN/MEDQ  D:  06/05/2002  T:  06/05/2002  Job:  [id number]   cc:   Joni Fears D. Young, M.D.  [address] [place]  [place] [address]  Fax: [phone]   Peter M. Swaziland, M.D.  [address]  [place], [place]  [address]  Fax: [phone]

## 2010-10-21 NOTE — Assessment & Plan Note (Signed)
[facility]                        [facility] OFFICE NOTE   NAME:[name]                         MRN:          [id number]  DATE:07/05/2006                            DOB:          09-18-1918    REASON FOR VISIT:  Establish care.   [name] is an 77 year old female here to establish care. Her chronic  medical problems include:  1. Asthma.  2. Depression.  3. Parkinson's.  4. Alzheimer's.  5. Hypertension.  6. Insomnia.   She states that she is overall doing well. She states that around [place] she had an asthma/bronchitis flare up. She was seen in Urgent  Care where she was treated with five days of prednisone. She is back to  baseline. She takes Pulmicort flex haler and uses albuterol very  infrequently. Ms. Pinheiro would like to try Advair because she is feels it  is easier to administer than her current Pulmicort Flex haler. She  denies any chest pain, shortness of breath, dyspnea on exertion from  baseline. She reports that she has baseline dyspnea for the last three  years, which is unchanged.   She also has a history of hypertension and takes Norvasc 5 mg daily. She  has not had any side effects from the medication. She states that she  has not had any blood work in over a year to check her kidney function.  She reports that her cholesterol has always run well and has never been  on cholesterol medications.   She has a history of Parkinson's, which she describes as very early and  takes Sinemet 25/100 mg three times a day. She states that it is very  efficacious.   She also has a history of depression which she treats with sertraline 50  mg daily. She states that it works well for her.   She also takes Razadyne ER 60 mg daily for Alzheimer's. The patient  currently lives on her own. Is very independent. Her daughter came in  with her today and feels comfortable that [name] can take care of her  herself.   PAST  MEDICAL HISTORY:  1. Asthma.  2. Chronic bronchitis.  3. Depression/anxiety.  4. Parkinson's.  5. Alzheimer's.  6. Hypertension.  7. Insomnia.  8. Coronary artery disease, status post myocardial infarction.   PAST SURGICAL HISTORY:  1. Cholecystectomy.  2. Tonsillectomy.   MEDICATIONS:  1. Pulmicort flex haler.  2. Norvasc 5 mg daily.  3. Sinemet 25/100 t.i.d.  4. Sertraline 50 mg daily.  5. Razadyne ER 60 mg daily.  6. Tylenol 500 mg t.i.d.  7. Naprosyn 200 mg b.i.d. p.r.n.  8. Multivitamin.  9. Vitamin E 200 units daily.  10.Aspirin 81 mg daily.  11.Calcium with vitamin D b.i.d.  12.Benadryl 25 mg b.i.d. p.r.n.  13.Prilosec over-the-counter p.r.n.  14.Valium 5 mg q nightly p.r.n. very infrequently.  15.Nitroglycerine 0.5 mg p.r.n. as needed.   ALLERGIES:  1. MORPHINE SULFATE.  2. DILAUDID.  3. HYDROCODONE.  4. CODEINE.   FAMILY HISTORY:  Has two brothers who are alive and two  sisters that  have passed away, one with lung cancer.   SOCIAL HISTORY:  The patient is widowed with one daughter and a son who  passed away at age 44 from non-Hodgkin's lymphoma. She denies any  tobacco use and almost never drinks alcohol.   REVIEW OF SYSTEMS:  As per HPI, otherwise unremarkable.   OBJECTIVE:  Weight is 156. Temperature 99.9, pulse 56. Blood pressure  126/82.  GENERAL: We have a pleasant female in no acute distress. Answers  questions appropriately. Alert and oriented x3.  HEENT: Unremarkable.  NECK: Supple with no lymphadenopathy, carotid bruits or  JVD heard on my  examination.  LUNGS:  Clear.  HEART: Regular rate and rhythm, normal S1, S2. No murmurs, gallops or  rubs.  EXTREMITIES: No cyanosis, clubbing or edema.  NEUROLOGIC: Nonfocal.  PSYCH: The patient mood appropriate. Normal affect. Speech is regular  rate and rhythm. Comprehension within normal limits.   IMPRESSION:  1. Hypertension.  2. Asthma.  3. Depression.  4. Parkinson's disease.  5.  Alzheimer's.  6. Insomnia.  7. History of coronary artery disease.   PLAN:  1. The patient will have medical records transferred to our office      from [facility].  2. Will obtain a non-fasting lipid profile and BMET today.  3. Agreed to switch her over to Advair 250 one puff b.i.d.  and she      will followup with me in one month for a recheck.  4. I will refill additional prescriptions as needed in the future.     Leanne Chang, M.D.  Electronically Signed    LA/MedQ  DD: 07/05/2006  DT: 07/05/2006  Job #: [id number]

## 2010-10-21 NOTE — H&P (Signed)
NAME:  LOUANNA, VANLIEW NO.:  [id number]   MEDICAL RECORD NO.:  [id number]                   PATIENT TYPE:  INP   LOCATION:  4736                                 FACILITY:  [facility]   PHYSICIAN:  Vesta Mixer, M.D.              DATE OF BIRTH:  24-Aug-1918   DATE OF ADMISSION:  06/03/2002  DATE OF DISCHARGE:                                HISTORY & PHYSICAL   REASON FOR ADMISSION:  Ms. 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  She presents with  episodes of chest pain.   HISTORY OF PRESENT ILLNESS:  The patient has a remote history of chest pain  years ago.  She had a stress echocardiogram in the past which was negative  for ischemia. The patient now presents with severe episodes of chest pain  under her left breast.  The pain radiated out to her left arm and down to  her elbow.  The pain was not relieved with Demerol but was finally relieved  with sublingual nitroglycerin.  The patient was not doing anything  exertional at the time.  She has been on a fairly typical diet.  The patient  denies any previous episodes of chest pain or shortness of breath and states  that she has been overall fairly healthy.   CURRENT MEDICATIONS:  1. Albuterol metered-dose inhaler.  2. Pulmicort metered-dose inhaler.  3. Aspirin 81 mg a day.  4. Nexium 40 mg a day.  5. Premarin 0.2 mg a day.  6. Phenergan with codeine once a day.  7. Valium 5 mg a day.   ALLERGIES:  She is allergic to MORPHINE, DILAUDID and HYDROCODONE.  She has  taken Demerol and has not had any reaction.   PAST MEDICAL HISTORY:  1. Asthma.  2. Chest pain.   SOCIAL HISTORY:  The patient does not smoke.   REVIEW OF SYSTEMS:  She denies any cough or cold symptoms and denies any  sputum production.   PHYSICAL EXAMINATION:  On examination she is an elderly female in no acute  distress.  She is alert and oriented x3.  Mood and  affect are normal.  VITALS:  Her heart rate is 92 and her blood pressure is 140/60.  HEENT:  Reveals 2+ carotids, she has no bruits.  She has no jugular venous  distention, no thyromegaly.  LUNGS:  Clear to auscultation.  HEART:  Regular rate, S1, S2.  She has no murmurs, gallops or rubs.  ABDOMEN:  Reveals good bowel sounds, it is nontender.  EXTREMITIES:  She has no clubbing, cyanosis or edema.  NEUROLOGIC:  Nonfocal.   LABORATORY DATA:  Her electrocardiogram reveals normal sinus rhythm with a  left bundle branch block.  Her laboratory data are pending.   ASSESSMENT AND PLAN:  Ms. Girardot presents with  episodes of chest pain,  unstable angina.  She is currently pain-free.  We will schedule her for a  heart catheterization tomorrow.  I think that she needs to be admitted and  have cardiac enzymes drawn.  We will continue with her current medications.  We discussed the risks, benefits and option of heart catheterization.  She  understands and agrees to proceed.  All of her other medical problems are  fairly stable.                                                Vesta Mixer, M.D.    PJN/MEDQ  D:  06/03/2002  T:  06/03/2002  Job:  [id number]   cc:   Vesta Mixer, M.D.  [address]  [place]  [place] [address]  Fax: [phone]   Peter M. [place], M.D.  [address]  [place], [place] [address]  Fax: [phone]   Anselmo Rod, M.D.  [address]  [place]  [place] [address]  Fax: [phone]

## 2010-11-09 ENCOUNTER — Ambulatory Visit: Payer: Self-pay | Admitting: Internal Medicine

## 2010-11-12 ENCOUNTER — Emergency Department (INDEPENDENT_AMBULATORY_CARE_PROVIDER_SITE_OTHER): Payer: Medicare Other

## 2010-11-12 ENCOUNTER — Emergency Department (HOSPITAL_BASED_OUTPATIENT_CLINIC_OR_DEPARTMENT_OTHER)
Admission: EM | Admit: 2010-11-12 | Discharge: 2010-11-12 | Disposition: A | Discharge status: Home / Self Care | Payer: Medicare Other | Attending: Emergency Medicine | Admitting: Emergency Medicine

## 2010-11-12 DIAGNOSIS — M79609 Pain in unspecified limb: Secondary | ICD-10-CM | POA: Insufficient documentation

## 2010-11-12 DIAGNOSIS — Z79899 Other long term (current) drug therapy: Secondary | ICD-10-CM | POA: Insufficient documentation

## 2010-11-12 DIAGNOSIS — J4489 Other specified chronic obstructive pulmonary disease: Secondary | ICD-10-CM | POA: Insufficient documentation

## 2010-11-12 DIAGNOSIS — G20A1 Parkinson's disease without dyskinesia, without mention of fluctuations: Secondary | ICD-10-CM | POA: Insufficient documentation

## 2010-11-12 DIAGNOSIS — I252 Old myocardial infarction: Secondary | ICD-10-CM | POA: Insufficient documentation

## 2010-11-12 DIAGNOSIS — I10 Essential (primary) hypertension: Secondary | ICD-10-CM | POA: Insufficient documentation

## 2010-11-12 DIAGNOSIS — G20 Parkinson's disease: Secondary | ICD-10-CM | POA: Insufficient documentation

## 2010-11-12 DIAGNOSIS — F0390 Unspecified dementia without behavioral disturbance: Secondary | ICD-10-CM | POA: Insufficient documentation

## 2010-11-12 DIAGNOSIS — J449 Chronic obstructive pulmonary disease, unspecified: Secondary | ICD-10-CM | POA: Insufficient documentation

## 2010-11-12 LAB — CBC
Hemoglobin: 12.7 g/dL (ref 12.0–15.0)
MCH: 29.5 pg (ref 26.0–34.0)
MCHC: 33.3 g/dL (ref 30.0–36.0)
RDW: 13.4 % (ref 11.5–15.5)

## 2010-11-12 LAB — BASIC METABOLIC PANEL
CO2: 27 mEq/L (ref 19–32)
Calcium: 10.2 mg/dL (ref 8.4–10.5)
Creatinine, Ser: 1.1 mg/dL (ref 0.4–1.2)
GFR calc non Af Amer: 47 mL/min — ABNORMAL LOW (ref >60)
Sodium: 139 mEq/L (ref 135–145)

## 2010-11-12 LAB — DIFFERENTIAL
Band Neutrophils: 0 % (ref 0–10)
Basophils Absolute: 0 10*3/uL (ref 0.0–0.1)
Basophils Relative: 0 % (ref 0–1)
Eosinophils Absolute: 0 10*3/uL (ref 0.0–0.7)
Eosinophils Relative: 0 % (ref 0–5)
Lymphocytes Relative: 16 % (ref 12–46)
Lymphs Abs: 1.3 10*3/uL (ref 0.7–4.0)
Monocytes Absolute: 0.1 10*3/uL (ref 0.1–1.0)
Monocytes Relative: 1 % — ABNORMAL LOW (ref 3–12)
Neutro Abs: 6.5 10*3/uL (ref 1.7–7.7)
Neutrophils Relative %: 83 % — ABNORMAL HIGH (ref 43–77)
Promyelocytes Absolute: 0 %

## 2010-11-12 LAB — D-DIMER, QUANTITATIVE: D-Dimer, Quant: 0.58 ug/mL-FEU — ABNORMAL HIGH (ref 0.00–0.48)

## 2010-11-12 LAB — CK: Total CK: 67 U/L (ref 7–177)

## 2010-11-13 ENCOUNTER — Emergency Department (INDEPENDENT_AMBULATORY_CARE_PROVIDER_SITE_OTHER): Payer: Medicare Other

## 2010-11-13 ENCOUNTER — Other Ambulatory Visit (HOSPITAL_BASED_OUTPATIENT_CLINIC_OR_DEPARTMENT_OTHER): Payer: Self-pay | Admitting: Emergency Medicine

## 2010-11-13 ENCOUNTER — Other Ambulatory Visit (HOSPITAL_BASED_OUTPATIENT_CLINIC_OR_DEPARTMENT_OTHER): Payer: Self-pay | Admitting: Physician Assistant

## 2010-11-13 ENCOUNTER — Emergency Department (HOSPITAL_BASED_OUTPATIENT_CLINIC_OR_DEPARTMENT_OTHER)
Admission: EM | Admit: 2010-11-13 | Discharge: 2010-11-13 | Disposition: A | Discharge status: Home / Self Care | Payer: Medicare Other | Source: Home / Self Care | Attending: Emergency Medicine | Admitting: Emergency Medicine

## 2010-11-13 ENCOUNTER — Emergency Department (HOSPITAL_BASED_OUTPATIENT_CLINIC_OR_DEPARTMENT_OTHER)
Admission: RE | Admit: 2010-11-13 | Discharge: 2010-11-13 | Disposition: A | Discharge status: Home / Self Care | Payer: Medicare Other | Source: Ambulatory Visit | Attending: Emergency Medicine | Admitting: Emergency Medicine

## 2010-11-13 ENCOUNTER — Ambulatory Visit (HOSPITAL_BASED_OUTPATIENT_CLINIC_OR_DEPARTMENT_OTHER): Admission: RE | Admit: 2010-11-13 | Payer: Medicare Other | Source: Ambulatory Visit | Admitting: Emergency Medicine

## 2010-11-13 ENCOUNTER — Emergency Department (HOSPITAL_BASED_OUTPATIENT_CLINIC_OR_DEPARTMENT_OTHER): Admission: RE | Admit: 2010-11-13 | Payer: Medicare Other | Source: Ambulatory Visit

## 2010-11-13 DIAGNOSIS — M79609 Pain in unspecified limb: Secondary | ICD-10-CM

## 2010-11-13 DIAGNOSIS — R52 Pain, unspecified: Secondary | ICD-10-CM

## 2010-11-13 DIAGNOSIS — R609 Edema, unspecified: Secondary | ICD-10-CM

## 2010-11-14 ENCOUNTER — Other Ambulatory Visit: Payer: Self-pay | Admitting: Internal Medicine

## 2010-11-16 ENCOUNTER — Telehealth: Payer: Self-pay | Admitting: Internal Medicine

## 2010-11-16 ENCOUNTER — Emergency Department (INDEPENDENT_AMBULATORY_CARE_PROVIDER_SITE_OTHER): Payer: Medicare Other

## 2010-11-16 ENCOUNTER — Inpatient Hospital Stay (HOSPITAL_COMMUNITY)
Admission: AD | Admit: 2010-11-16 | Discharge: 2010-11-21 | DRG: 192 | Disposition: A | Discharge status: Skilled Nursing Facility | Payer: Medicare Other | Source: Other Acute Inpatient Hospital | Attending: Family Medicine | Admitting: Family Medicine

## 2010-11-16 ENCOUNTER — Emergency Department (HOSPITAL_BASED_OUTPATIENT_CLINIC_OR_DEPARTMENT_OTHER)
Admission: EM | Admit: 2010-11-16 | Discharge: 2010-11-16 | Disposition: A | Discharge status: Home / Self Care | Payer: Medicare Other | Source: Home / Self Care | Attending: Emergency Medicine | Admitting: Emergency Medicine

## 2010-11-16 DIAGNOSIS — K7689 Other specified diseases of liver: Secondary | ICD-10-CM

## 2010-11-16 DIAGNOSIS — D649 Anemia, unspecified: Secondary | ICD-10-CM | POA: Diagnosis present

## 2010-11-16 DIAGNOSIS — R0902 Hypoxemia: Secondary | ICD-10-CM | POA: Diagnosis present

## 2010-11-16 DIAGNOSIS — G20A1 Parkinson's disease without dyskinesia, without mention of fluctuations: Secondary | ICD-10-CM | POA: Diagnosis present

## 2010-11-16 DIAGNOSIS — I252 Old myocardial infarction: Secondary | ICD-10-CM

## 2010-11-16 DIAGNOSIS — I10 Essential (primary) hypertension: Secondary | ICD-10-CM | POA: Diagnosis present

## 2010-11-16 DIAGNOSIS — R748 Abnormal levels of other serum enzymes: Secondary | ICD-10-CM | POA: Diagnosis present

## 2010-11-16 DIAGNOSIS — R05 Cough: Secondary | ICD-10-CM

## 2010-11-16 DIAGNOSIS — G20 Parkinson's disease: Secondary | ICD-10-CM | POA: Diagnosis present

## 2010-11-16 DIAGNOSIS — I517 Cardiomegaly: Secondary | ICD-10-CM

## 2010-11-16 DIAGNOSIS — F0390 Unspecified dementia without behavioral disturbance: Secondary | ICD-10-CM | POA: Diagnosis present

## 2010-11-16 DIAGNOSIS — R0602 Shortness of breath: Secondary | ICD-10-CM

## 2010-11-16 DIAGNOSIS — J479 Bronchiectasis, uncomplicated: Secondary | ICD-10-CM

## 2010-11-16 DIAGNOSIS — K219 Gastro-esophageal reflux disease without esophagitis: Secondary | ICD-10-CM | POA: Diagnosis present

## 2010-11-16 DIAGNOSIS — J441 Chronic obstructive pulmonary disease with (acute) exacerbation: Principal | ICD-10-CM | POA: Diagnosis present

## 2010-11-16 DIAGNOSIS — J90 Pleural effusion, not elsewhere classified: Secondary | ICD-10-CM

## 2010-11-16 LAB — CBC
Platelets: 121 10*3/uL — ABNORMAL LOW (ref 150–400)
RBC: 3.94 MIL/uL (ref 3.87–5.11)
RDW: 13.5 % (ref 11.5–15.5)
WBC: 17.4 10*3/uL — ABNORMAL HIGH (ref 4.0–10.5)

## 2010-11-16 LAB — COMPREHENSIVE METABOLIC PANEL
ALT: 22 U/L (ref 0–35)
AST: 28 U/L (ref 0–37)
Albumin: 3.5 g/dL (ref 3.5–5.2)
Alkaline Phosphatase: 102 U/L (ref 39–117)
CO2: 24 mEq/L (ref 19–32)
Chloride: 102 mEq/L (ref 96–112)
Potassium: 3.9 mEq/L (ref 3.5–5.1)
Sodium: 139 mEq/L (ref 135–145)
Total Bilirubin: 0.6 mg/dL (ref 0.3–1.2)

## 2010-11-16 LAB — DIFFERENTIAL
Basophils Absolute: 0 10*3/uL (ref 0.0–0.1)
Basophils Relative: 0 % (ref 0–1)
Eosinophils Absolute: 0 10*3/uL (ref 0.0–0.7)
Eosinophils Relative: 0 % (ref 0–5)
Metamyelocytes Relative: 0 %
Myelocytes: 0 %
Promyelocytes Absolute: 0 %
nRBC: 0 /100 WBC

## 2010-11-16 LAB — CARDIAC PANEL(CRET KIN+CKTOT+MB+TROPI)
CK, MB: 3.3 ng/mL (ref 0.3–4.0)
Total CK: 83 U/L (ref 7–177)
Troponin I: 0.62 ng/mL (ref <0.30)

## 2010-11-16 LAB — TROPONIN I: Troponin I: 0.86 ng/mL (ref <0.30)

## 2010-11-16 LAB — LACTIC ACID, PLASMA: Lactic Acid, Venous: 1 mmol/L (ref 0.5–2.2)

## 2010-11-16 LAB — CK TOTAL AND CKMB (NOT AT ARMC): Total CK: 106 U/L (ref 7–177)

## 2010-11-16 MED ORDER — IOHEXOL 350 MG/ML SOLN
80.0000 mL | Freq: Once | INTRAVENOUS | Status: AC | PRN
  Administered 2010-11-16: 80 mL via INTRAVENOUS

## 2010-11-16 NOTE — Telephone Encounter (Signed)
Spoke with pt's daughter. I offered ov with [facility] for tomorrow am at 9 am and she refused. She states that pt can not wait until then- she is having increased SOB, cough and has no appetite. I advised if she feels that pt is this acutely ill, needs to take her to ED. She verbalized understanding and states that this is what she will do.

## 2010-11-16 NOTE — Telephone Encounter (Signed)
ATC pt at home #.  Line rings multiple times and then goes to a VM but the message gets cut off/interupted by a tone that sounds like a fax machine.  WCB

## 2010-11-16 NOTE — Telephone Encounter (Signed)
[facility] has openings tomorrow- ATC line busy x 4 WCB

## 2010-11-17 DIAGNOSIS — R7989 Other specified abnormal findings of blood chemistry: Secondary | ICD-10-CM

## 2010-11-17 DIAGNOSIS — R079 Chest pain, unspecified: Secondary | ICD-10-CM

## 2010-11-17 LAB — LEGIONELLA ANTIGEN, URINE: Legionella Antigen, Urine: NEGATIVE

## 2010-11-17 LAB — CARDIAC PANEL(CRET KIN+CKTOT+MB+TROPI)
CK, MB: 3.3 ng/mL (ref 0.3–4.0)
Relative Index: INVALID (ref 0.0–2.5)
Relative Index: INVALID (ref 0.0–2.5)
Total CK: 78 U/L (ref 7–177)
Total CK: 92 U/L (ref 7–177)
Troponin I: 0.45 ng/mL (ref <0.30)

## 2010-11-17 LAB — CBC
MCH: 28.6 pg (ref 26.0–34.0)
MCHC: 32.2 g/dL (ref 30.0–36.0)
MCV: 88.8 fL (ref 78.0–100.0)
Platelets: 117 10*3/uL — ABNORMAL LOW (ref 150–400)
RBC: 3.67 MIL/uL — ABNORMAL LOW (ref 3.87–5.11)

## 2010-11-17 LAB — BASIC METABOLIC PANEL
CO2: 24 mEq/L (ref 19–32)
Chloride: 103 mEq/L (ref 96–112)
GFR calc Af Amer: 60 mL/min — ABNORMAL LOW (ref >60)
Potassium: 4.2 mEq/L (ref 3.5–5.1)
Sodium: 138 mEq/L (ref 135–145)

## 2010-11-17 NOTE — H&P (Signed)
[name], [name]:  [id number]  MEDICAL RECORD NO.:  [id number]  LOCATION:  1423                         FACILITY:  [facility]  PHYSICIAN:  Andreas Blower, MD       DATE OF BIRTH:  [birth year]  DATE OF ADMISSION:  11/16/2010 DATE OF DISCHARGE:                             HISTORY & PHYSICAL   PRIMARY CARE PHYSICIAN:  Dr. [name].  PULMONOLOGIST:  Rennis Chris. [name], MD, FCCP, FACP  CARDIOLOGIST:  [facility].  CHIEF COMPLAINT:  Shortness of breath and cough.  HISTORY OF PRESENT ILLNESS:  [name] is an 77 year old Caucasian female with history of hypertension, dementia, Parkinson disease, history of asthma versus possible COPD, nonobstructive coronary artery disease status post cardiac cath in 2003, who presents with shortness of breath. She reports that over the last year, she has been increasingly short of breath and has had intermittent cough.  However, over the last month, she has had increasing shortness of breath with brownish-colored productive sputum.  She came to the ER on November 13, 2010, complaining about right thigh pain.  She had an ultrasound test done, which did not show any evidence of her right lower extremity DVT.  She presented today again with shortness of breath and cough.  As a result, the patient was sent from [facility] to be admitted for management of shortness of breath.  The patient denies any recent fever.  Does feel short of breath.  Does complain about intermittent left-sided chest pain radiating to her back, but currently denies any chest pain now.  Denies any abdominal pain, diarrhea.  Denies any headaches or vision changes.  REVIEW OF SYSTEMS:  All systems were reviewed with the patient, was positive as per HPI, otherwise negative.  PAST MEDICAL HISTORY: 1. History of nonobstructive coronary artery disease, status post cath     in 2003. 2. Hypertension. 3. Dementia. 4. History of Parkinson disease. 5.  History of has asthma versus possible COPD.  SOCIAL HISTORY:  The patient has never smoked.  Currently, denies drinking any alcohol, used to drink alcohol when she was young. Currently, lives in assisted living facility.  FAMILY HISTORY:  Her mother died in her 30s from cancer.  Father died about 80 years ago from a truck accident, otherwise noncontributory.  HOME MEDICATIONS:  Not available at this time.  PHYSICAL EXAM:  VITAL SIGNS:  Temperature is 97.6, pulse 92, respirations 18, blood pressure 146/88, satting at 95% on 2 L of oxygen. GENERAL:  The patient was awake, appeared to be in some distress from shortness of breath.  She has difficulty even saying complete sentences, would be short of breath after every other word, was laying in bed. HEENT:  Extraocular motions are intact.  Pupils equal and round.  Had moist mucous membranes. NECK:  Supple. HEART:  Regular with S1 and S2. LUNGS:   Had moderate air movement. Crackles at bases bilaterally. ABDOMEN:  Soft, nontender, nondistended.  Positive bowel sounds. EXTREMITIES:  The patient has good peripheral pulses.  Trace edema. NEUROLOGIC:  Cranial nerves II through XII grossly intact,  5/5 motor strength in upper as well as lower extremities.  LABORATORY  DATA:  CT of the chest with contrast showed chronic bronchiectasis at the bases.  Cardiomegaly without pulmonary edema noted.  No evidence for acute PE noted.  Small hepatic cyst.  The patient had chest x-ray, 2-view, which showed mild cardiomegaly with increased interstitial markings and a small pleural effusion, question congestive heart failure.  CBC shows white count of 17.4, hemoglobin 11.5, hematocrit 34.6, platelet count 121,000.  Electrolytes normal with a BUN of 28, creatinine 1.10.  Initial troponin was 0.86.  Repeat troponin was 0.62. BNP was 2749.  Blood cultures x2 pending and done.  ASSESSMENT/PLAN: 1. Shortness of breath, likely due to bronchitis versus  possible     pneumonia.  The patient was started on ceftriaxone in the ER, which     we will continue.  We will also continue azithromycin.  We will     send for urine legionella.  Dr. Cleora Fleet, pulmonologist covering for     Dr. [name], has been notified.  If Dr. [name] does not see the     patient tomorrow, we will need to notify his office that the     patient is here for his input. 2. Elevated troponin.  EKG shows left bundle-branch block which is     chronic.  Spoke with Dr. Gala Romney with [facility] who     recommended only subcu heparin.  We will start the patient on     aspirin and low-dose beta-blocker.  The patient reports that she     may have had an ultrasound test of her heart done recently.  We     will follow up on those records.  Dr. Gala Romney indicated that he     will contact his group in the morning and have the patient be seen.     However, we will also need to call his group and notify the patient     is here. Uncertain if elevated troponin is due to demand ischemia     versus non-st elevation myocardial infarction. 3. Leukocytosis, likely due to bronchitis versus pneumonia.  Monitor     for now. 4. Thrombocytopenia, monitor for now. 5. Hypertension, stable at this time.  Continue home medications once     known. 6. History of nonobstructive coronary artery disease.  Continue     aspirin and management as above. 7. History of dementia, stable.  The patient is able to provide a good     history at this time. 8. History of Parkinson disease, stable. 9. History of asthma versus chronic obstructive pulmonary disease.     The patient does not have any wheezing on exam.  Less likely that     her shortness of breath is caused by asthma or possible COPD.  Dr.     [name] will need to be notified in the morning. 10.Prophylaxis.  Subcu heparin. 11.Code status, DNR/DNI.  This was discussed with the patient at the     time of admission.  The patient also indicated that her  daughter is     the only other living relative.  The patient initially did not want     to come to the hospital and did not want aggressive medical test     workup.  However, to please her daughter, she presented to the ER.  Time spent on admission talking to the patient and coordinating care was 1 hour.   Andreas Blower, MD   SR/MEDQ  D:  11/16/2010  T:  11/17/2010  Job:  [id number]  Electronically Signed by Wardell Heath Nakul Avino  on 11/17/2010 08:04:40 AM

## 2010-11-18 DIAGNOSIS — I219 Acute myocardial infarction, unspecified: Secondary | ICD-10-CM

## 2010-11-18 LAB — BASIC METABOLIC PANEL WITH GFR
BUN: 29 mg/dL — ABNORMAL HIGH (ref 6–23)
CO2: 26 meq/L (ref 19–32)
Calcium: 9.5 mg/dL (ref 8.4–10.5)
Chloride: 102 meq/L (ref 96–112)
Creatinine, Ser: 1.06 mg/dL (ref 0.50–1.10)
GFR calc Af Amer: 59 mL/min — ABNORMAL LOW
GFR calc non Af Amer: 49 mL/min — ABNORMAL LOW
Glucose, Bld: 135 mg/dL — ABNORMAL HIGH (ref 70–99)
Potassium: 4.1 meq/L (ref 3.5–5.1)
Sodium: 137 meq/L (ref 135–145)

## 2010-11-18 LAB — LIPID PANEL
LDL Cholesterol: 70 mg/dL (ref 0–99)
VLDL: 27 mg/dL (ref 0–40)

## 2010-11-18 LAB — CULTURE, BLOOD (ROUTINE X 2): Culture  Setup Time: 201206140102

## 2010-11-18 LAB — CBC
MCH: 28.7 pg (ref 26.0–34.0)
MCHC: 32.5 g/dL (ref 30.0–36.0)
MCV: 88.5 fL (ref 78.0–100.0)
Platelets: 128 10*3/uL — ABNORMAL LOW (ref 150–400)
RBC: 3.9 MIL/uL (ref 3.87–5.11)

## 2010-11-18 NOTE — Consult Note (Signed)
[name], BREEDING NO.:  [id number]  MEDICAL RECORD NO.:  [id number]  LOCATION:  1423                         FACILITY:  [facility]  PHYSICIAN:  Verne Carrow, MDDATE OF BIRTH:  [birth year]  DATE OF CONSULTATION:  11/17/2010 DATE OF DISCHARGE:                                CONSULTATION   REASON FOR CONSULTATION:  Abnormal cardiac enzymes.  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  The patient has a history of moderate COPD and asthma as well as mild dementia, hypertension and nonobstructive coronary artery disease by catheterization in 2003.  She has been treated over the last 24 hours with antibiotics for her bronchitis.  Her D-dimer was mildly elevated.  CT of the chest was negative for pulmonary embolism.  However, there were changes consistent with bronchiectasis. The patient yesterday complained of mild chest pain with coughing and cardiac enzymes were checked.  Her troponin peak was 0.86 with a normal CK level.  Troponin is 0.37 today.  She has no active chest pain now. Her only complaint is mild dyspnea.  PAST MEDICAL HISTORY: 1. Hypertension, all antihypertensives were stopped last year. 2. COPD/asthma. 3. Mild nonobstructive coronary artery disease by cath in 2003. 4. Memory problems with possible mild dementia, although she is very     functional.  PAST SURGICAL HISTORY: 1. Cholecystectomy. 2. Tonsillectomy.  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  She has never been a smoker, although she lived with a smoker.  She denied use of alcohol or drugs.  She lives in assisted living facility and is very independent.  FAMILY HISTORY:   There is no family history of premature coronary artery disease.  ALLERGIES:  MORPHINE, CODEINE, HYDROMORPHONE, HYDROCODONE, SINGULAIR.  HOSPITAL MEDICATIONS:  Aspirin 325 mg once daily, azithromycin 500 mg once daily, Rocephin 1 g IV once daily, heparin 5000 units subcu t.i.d., Lopressor 12.5 mg p.o. b.i.d.  REVIEW OF SYSTEMS:  As stated in the history of present illness, otherwise, negative.  PHYSICAL EXAMINATION:  VITAL SIGNS:  Temperature 97.6, pulse 74 and regular, respirations 18 and unlabored, blood pressure 157/61.  She satting 93% on 2 L of oxygen by nasal cannula. GENERAL:  She is alert and oriented x3.  She is in no acute distress. NECK:  No JVD.  No carotid bruits.  No thyromegaly.  No lymphadenopathy. SKIN:  Warm and dry. MUSCULOSKELETAL:  Moves all extremities equally. NEUROLOGICAL:  Nonfocal. PSYCHIATRIC:  Mood and affect are appropriate. HEENT:  Normal. LUNGS:  Mild rhonchi bilaterally. CARDIOVASCULAR:  Regular rate and rhythm with no loud murmurs, gallops or rubs noted. ABDOMEN:  Soft, nontender.  Bowel sounds are present. EXTREMITIES:  No evidence of edema.  Pulses 2+ in all extremities.  DIAGNOSTIC STUDIES: 1. CT of the chest is negative for pulmonary embolism, however, there     is basilar bronchiectasis noted. 2.  Laboratory values show white blood cell count 20,000, hemoglobin     10, platelets 117, potassium 4.2, creatinine 1.05, glucose 107,     troponin 0.86 yesterday and now down to 0.37 in the setting of a     normal CK. 3. A 12-lead EKG shows normal sinus rhythm with rate of 80 beats per     minute and left bundle-branch block.  This is the EKG from November 16, 2010, at 10:30 p.m..  ASSESSMENT AND PLAN:  This is a very pleasant 77 year old Caucasian female who is known to have a history of very mild nonobstructive coronary artery disease by catheterization in 2003, whose main medical problem has been chronic obstructive pulmonary disease and  asthma.  She is now admitted to [facility] with acute bronchitis and chronic obstructive pulmonary disease exacerbation.  She is currently having productive sputum and shortness of breath.  She is being treated for her bronchitis with 2 antibiotics.  We are consulted for the evaluation of mild chest pain and mildly abnormal troponin.  I have had a long discussion with the patient and her daughter the bedside.  Given her advanced age and her lack of ongoing ischemic symptoms such as chest pain or markedly abnormal cardiac enzymes, I would not pursue further cardiac workup at this time.  The patient is in agreement with this and does not wish to pursue any cardiac workup.  I would stop cycling cardiac enzymes.  I will continue her aspirin.  I will be glad to see her in my office if this is necessary.  I have given the patient and her daughter my name and office location.  We will be glad to see this patient if you need [place] during the remainder of the hospitalization, otherwise, we will sign off.     Verne Carrow, MD     CM/MEDQ  D:  11/17/2010  T:  11/17/2010  Job:  [id number]  Electronically Signed by Verne Carrow MD on 11/18/2010 04:18:02 PM

## 2010-11-21 NOTE — Discharge Summary (Signed)
[name], PUNCH NO.:  [id number]  MEDICAL RECORD NO.:  [id number]  LOCATION:  1423                         FACILITY:  [facility]  PHYSICIAN:  Brendia Sacks, MD    DATE OF BIRTH:  1919-01-09  DATE OF ADMISSION:  11/16/2010 DATE OF DISCHARGE:  11/21/2010                        DISCHARGE SUMMARY - REFERRING   CONDITION ON DISCHARGE:  Improved.  DISPOSITION:  Skilled facility for rehabilitation.  DISCHARGE DIAGNOSES: 1. Acute bronchitis. 2. Chronic obstructive pulmonary disease. 3. Positive troponin, no further evaluation indicated. 4. Stable anemia and thrombocytopenia.  HISTORY OF PRESENT ILLNESS:  This is an 77 year old woman who presented to the emergency room with shortness of breath and cough.  HOSPITAL COURSE:  Acute bronchitis.  The patient was admitted to medical floor, placed on IV antibiotics, continued on steroids, nebulizer treatments and oxygen.  She has had marked improvement in her symptoms and stable for discharge to skilled facility.  She has finished a course of Zithromax and Rocephin on discharge.  She continued with nebulizer therapy as well as a steroid taper.  Note that she does not feel she has had improvement with Advair in the outpatient setting, has problems with this inhaler.  Would consider inhaled budesonide defer this to the outpatient facility when her steroid taper is complete.  Positive troponin.  The patient was seen in consultation with Cardiology.  She had minimal elevation of her troponin and no chest pain associated with this and cardiology recommended specifically no further evaluation.  Her mildly elevated troponin was probably related to her COPD and acute bronchitis.  Stable anemia and thrombocytopenia.  Minimal in nature.  Would consider further evaluation in the outpatient setting, but at this point no further inpatient evaluation is indicated.  CONSULTATIONS:  Cardiology recommendations as  above.  PROCEDURES:  None.  IMAGING:  Chest x-ray on June 13th:  Mild cardiomegaly with increased interstitial markings and small pleural effusions.  CT angiogram of the chest June 13:, chronic bronchitic changes, bronchiectasis at the bases. Cardiomegaly without pulmonary edema.  No evidence of pulmonary embolism.  Small hepatic cyst seen.  MICROBIOLOGY:  Blood cultures 1/2 positive for coagulase-negative Staphylococcus, contaminant.  Other blood culture, no growth to date. The Legionella urinary antigen negative.  ANCILLARY STUDIES:  2-D echocardiogram showed a left ventricular ejection fraction of 60%.  Mild left ventricular hypertrophy.  EKG showed normal sinus rhythm, left bundle-branch block.  PERTINENT LABORATORY STUDIES: 1. CBC notable for hemoglobin of 11.2, and platelet count 128, which     appear to be stable. 2. Basic metabolic panel essentially unremarkable with a creatinine of     1.06. 3. Cardiac markers notable for troponin peak of 0.86.  Mild BNP     elevation without clinical symptoms.  DISCHARGE INSTRUCTIONS:  The patient will be discharged on June 18th to a skilled facility for rehabilitation.  Diet is a low-sodium, heart- healthy diet.  Increase activity slowly.  Follow up with Dr. [name] after release from your from skilled nursing facility.  DISCHARGE MEDICATIONS: 1. Albuterol and Atrovent nebulizers every 6 hours as needed for     shortness of breath. 2. Prednisone 10 mg p.o. daily, last dose November 24, 2010. 3. Aspirin enteric-coated 81 mg p.o. daily. 4. Multivitamin p.o. daily. 5. Vitamin E 400 international units p.o. daily. 6. Discontinue the following medications:  Advair Diskus, Naprosyn.  THINGS TO FOLLOW-UP IN THE OUTPATIENT SETTING: 1. Although 2d echocardiogram showed no evidence of heart failure, patient has a history (as an outpatient) of lower extremity edema. Could consider low-dose Lasix daily as needed. Suggest following daily or weekly  weights. 2. Considered inhaled budesonide after steroid taper is complete. Patient has not tolerated Advair.  Time coordinating discharge is 25 minutes.     Brendia Sacks, MD     DG/MEDQ  D:  11/20/2010  T:  11/20/2010  Job:  [id number]  Electronically Signed by Brendia Sacks  on 11/21/2010 09:27:08 AM

## 2010-11-23 LAB — CULTURE, BLOOD (ROUTINE X 2)
Culture  Setup Time: 201206140103
Culture: NO GROWTH

## 2010-12-05 ENCOUNTER — Telehealth: Payer: Self-pay | Admitting: Internal Medicine

## 2010-12-05 NOTE — Telephone Encounter (Signed)
Spoke with Victorino Dike and she states just wanted to let [facility] know that they are still providing pt with PT, but she refused OT. Will forward to [facility] as FYI.

## 2011-02-02 ENCOUNTER — Ambulatory Visit (INDEPENDENT_AMBULATORY_CARE_PROVIDER_SITE_OTHER): Payer: Medicare Other | Admitting: Internal Medicine

## 2011-02-02 VITALS — BP 122/60 | HR 82

## 2011-02-02 DIAGNOSIS — J479 Bronchiectasis, uncomplicated: Secondary | ICD-10-CM | POA: Insufficient documentation

## 2011-02-02 NOTE — Assessment & Plan Note (Signed)
CT confirmed bibasilar bronchitis and bronchiectasis.  They feel it is helping to get PT from a practitioner in her apartment complex Considered [facility], but they are doing well now.  She declines the flu shot.

## 2011-02-02 NOTE — Patient Instructions (Signed)
Please call as needed 

## 2011-02-02 NOTE — Progress Notes (Signed)
Subjective:    Patient ID: [name], female    DOB: 03-26-1919, 77 y.o.   MRN: [id number]  HPI    Review of Systems     Objective:   Physical Exam        Assessment & Plan:   Subjective:    Patient ID: [name], female    DOB: 1918/09/18, 77 y.o.   MRN: [id number]  HPI 77 yoFnever smoker, here with daughter(pharmacist) , followed for asthma and dyspnea with complicating co-morbidities reviewed. Last here 05/13/11. Since then in last few weeks has been using neb up to twice daily. If used too late it will keep her up, but not noting palpitation. Cough more productive of white sputum. Twinges of pain around chest, but no anginal pains. Some wheeze. Feet swell.   02/02/11- 89 yoFnever smoker, here with daughter(pharmacist) , followed for asthma and dyspnea with complicating co-morbidities reviewed. Was hosp [facility] with pneumonia in mid-summer. She still doesn't feel fully recovered. Feels speech isn't quite as articulate, but was not told that she had a neurologic event..  CT 11/16/10- negative for clot or pneumonia, but postive for bronchitis, bronchiectasis.   Review of Systems See HPI Constitutional:   No weight loss, night sweats,  Fevers, chills, fatigue, lassitude. HEENT:   No headaches,  Difficulty swallowing,  Tooth/dental problems,  Sore throat,                No sneezing, itching, ear ache, nasal congestion, post nasal drip,  CV: Orthopnea, PND, swelling in lower extremities, anasarca, dizziness, palpitations GI  No heartburn, indigestion, abdominal pain, nausea, vomiting, diarrhea, change in bowel habits, loss of appetite Resp:.   excess mucus ,productive cough,non-productive cough,  No coughing up of blood.  No change in color of mucus.  No wheezing.  Skin: no rash or lesions. GU: no dysuria, change in color of urine, no urgency or frequency.  No flank pain. MS:  No joint pain or swelling.  No decreased range of motion.  No back pain. Psych:  No change in mood or  affect. No depression or anxiety.  No memory loss.      Objective:   Physical Exam General- Alert, Oriented, Affect-appropriate, Distress- none acute, using a walker Skin- rash-none, lesions- none, excoriation- none Lymphadenopathy- none Head- atraumatic            Eyes- Gross vision intact, PERRLA, conjunctivae clear secretions            Ears- Hearing, canals-normal            Nose- Clear, no-Septal dev, mucus, polyps, erosion, perforation             Throat- Mallampati II , mucosa clear , drainage- none, tonsils- atrophic Neck- flexible , trachea midline, no stridor , thyroid nl, carotid no bruit Chest - symmetrical excursion , unlabored           Heart/CV- RRR , no murmur , no gallop  , no rub, nl s1 s2                           - JVD- none , edema- 2+, stasis changes- none, varices- none           Lung- bilateral mild rhonchi/crackles, wheeze- none, cough- none , dullness-none, rub- none           Chest wall-  Abd- tender-no, distended-no, bowel sounds-present, HSM- no Br/ Gen/ Rectal- Not done, not  indicated Extrem- cyanosis- none, clubbing, none, atrophy- none, strength- nl Neuro- speech a little hesitant, not slurred          Assessment & Plan:

## 2011-02-06 ENCOUNTER — Encounter: Payer: Self-pay | Admitting: Internal Medicine

## 2011-02-22 LAB — BASIC METABOLIC PANEL
BUN: 11
BUN: 8
CO2: 28
CO2: 29
Calcium: 8.9
Calcium: 8.9
Calcium: 9.2
Chloride: 109
Creatinine, Ser: 0.86
Creatinine, Ser: 0.96
GFR calc Af Amer: >60
GFR calc Af Amer: >60
GFR calc non Af Amer: >60
GFR calc non Af Amer: >60
GFR calc non Af Amer: >60
Glucose, Bld: 103 — ABNORMAL HIGH
Glucose, Bld: 115 — ABNORMAL HIGH
Glucose, Bld: 115 — ABNORMAL HIGH
Potassium: 2.5 — CL
Potassium: 3.2 — ABNORMAL LOW
Potassium: 3.4 — ABNORMAL LOW
Sodium: 138
Sodium: 142

## 2011-02-22 LAB — TROPONIN I
Troponin I: 0.03
Troponin I: 0.03

## 2011-02-22 LAB — CK TOTAL AND CKMB (NOT AT ARMC)
CK, MB: 1.2
CK, MB: 1.3
Relative Index: INVALID
Relative Index: INVALID
Total CK: 53
Total CK: 56

## 2011-02-22 LAB — DIFFERENTIAL
Basophils Absolute: 0
Basophils Relative: 0
Eosinophils Absolute: 0.1
Eosinophils Relative: 2
Lymphocytes Relative: 37
Lymphs Abs: 1.2
Monocytes Absolute: 0.3
Monocytes Relative: 8
Neutro Abs: 1.8
Neutrophils Relative %: 54

## 2011-02-22 LAB — POCT I-STAT CREATININE
Creatinine, Ser: 1
Operator id: 294511

## 2011-02-22 LAB — SAMPLE TO BLOOD BANK

## 2011-02-22 LAB — PROTIME-INR
INR: 3.2 — ABNORMAL HIGH
INR: 3.5 — ABNORMAL HIGH
Prothrombin Time: 34.2 — ABNORMAL HIGH
Prothrombin Time: 36.2 — ABNORMAL HIGH

## 2011-02-22 LAB — I-STAT 8, (EC8 V) (CONVERTED LAB)
Acid-Base Excess: 4 — ABNORMAL HIGH
BUN: 11
Bicarbonate: 28.6 — ABNORMAL HIGH
Chloride: 106
Glucose, Bld: 110 — ABNORMAL HIGH
HCT: 33 — ABNORMAL LOW
Hemoglobin: 11.2 — ABNORMAL LOW
Operator id: 294511
Potassium: 2.6 — CL
Sodium: 143
TCO2: 30
pCO2, Ven: 40.9 — ABNORMAL LOW
pH, Ven: 7.453 — ABNORMAL HIGH

## 2011-02-22 LAB — CBC
HCT: 30.5 — ABNORMAL LOW
Hemoglobin: 10.4 — ABNORMAL LOW
MCHC: 34.2
MCV: 86.1
Platelets: 153
RBC: 3.55 — ABNORMAL LOW
RDW: 14.3
WBC: 3.4 — ABNORMAL LOW

## 2011-02-22 LAB — POCT CARDIAC MARKERS
CKMB, poc: <1 — ABNORMAL LOW
Myoglobin, poc: 73.3
Operator id: 294511
Troponin i, poc: <0.05

## 2011-02-22 LAB — APTT: aPTT: 44 — ABNORMAL HIGH

## 2011-02-22 LAB — D-DIMER, QUANTITATIVE: D-Dimer, Quant: 0.42

## 2011-02-22 LAB — BASIC METABOLIC PANEL WITH GFR
CO2: 28
Chloride: 104
GFR calc Af Amer: >60
Sodium: 142

## 2011-03-10 LAB — PROTIME-INR: Prothrombin Time: 30 — ABNORMAL HIGH

## 2011-03-13 LAB — CBC
HCT: 27.2 — ABNORMAL LOW
HCT: 27.5 — ABNORMAL LOW
HCT: 28.1 — ABNORMAL LOW
HCT: 37.7
Hemoglobin: 9.5 — ABNORMAL LOW
Hemoglobin: 10 — ABNORMAL LOW
Hemoglobin: 10.4 — ABNORMAL LOW
Hemoglobin: 9.9 — ABNORMAL LOW
MCHC: 33.9
MCHC: 34.8
MCHC: 34.2
MCHC: 34.9
MCV: 88
MCV: 88.8
MCV: 89.5
Platelets: 138 — ABNORMAL LOW
Platelets: 160
Platelets: 109 — ABNORMAL LOW
Platelets: 172
RBC: 3.09 — ABNORMAL LOW
RBC: 3.1 — ABNORMAL LOW
RBC: 3.25 — ABNORMAL LOW
RBC: 3.35 — ABNORMAL LOW
RDW: 13.5
RDW: 13.9
RDW: 14.3
WBC: 3.3 — ABNORMAL LOW
WBC: 5.6
WBC: 5.3
WBC: 6.3

## 2011-03-13 LAB — PROTIME-INR
INR: 1.3
INR: 1.8 — ABNORMAL HIGH
INR: 2.2 — ABNORMAL HIGH
INR: 2.3 — ABNORMAL HIGH
INR: 2.4 — ABNORMAL HIGH
INR: 1.1
INR: 1.1
Prothrombin Time: 16.9 — ABNORMAL HIGH
Prothrombin Time: 21.8 — ABNORMAL HIGH
Prothrombin Time: 24.9 — ABNORMAL HIGH
Prothrombin Time: 26.2 — ABNORMAL HIGH
Prothrombin Time: 26.8 — ABNORMAL HIGH
Prothrombin Time: 13.2
Prothrombin Time: 14.1

## 2011-03-13 LAB — COMPREHENSIVE METABOLIC PANEL
ALT: 14
ALT: <8
AST: 24
AST: 45 — ABNORMAL HIGH
Albumin: 2.9 — ABNORMAL LOW
Albumin: 2.9 — ABNORMAL LOW
Albumin: 4.2
Alkaline Phosphatase: 64
Alkaline Phosphatase: 92
BUN: 16
BUN: 17
BUN: 17
CO2: 29
CO2: 31
Calcium: 8.9
Calcium: 9.1
Calcium: 9.5
Chloride: 100
Chloride: 102
Chloride: 101
Creatinine, Ser: 1
Creatinine, Ser: 1.04
Creatinine, Ser: 1.21 — ABNORMAL HIGH
GFR calc Af Amer: >60
GFR calc Af Amer: >60
GFR calc Af Amer: 51 — ABNORMAL LOW
GFR calc non Af Amer: 50 — ABNORMAL LOW
GFR calc non Af Amer: 53 — ABNORMAL LOW
Glucose, Bld: 106 — ABNORMAL HIGH
Glucose, Bld: 134 — ABNORMAL HIGH
Potassium: 3.7
Potassium: 3.9
Sodium: 135
Sodium: 141
Total Bilirubin: 0.7
Total Bilirubin: 0.9
Total Bilirubin: 1.1
Total Protein: 5.8 — ABNORMAL LOW
Total Protein: 6.1

## 2011-03-13 LAB — URINALYSIS, ROUTINE W REFLEX MICROSCOPIC
Bilirubin Urine: NEGATIVE
Glucose, UA: NEGATIVE
Hgb urine dipstick: NEGATIVE
Ketones, ur: NEGATIVE
Nitrite: NEGATIVE
Nitrite: NEGATIVE
Protein, ur: NEGATIVE
Specific Gravity, Urine: 1.019
Specific Gravity, Urine: 1.006
Urobilinogen, UA: 0.2
pH: 5.5
pH: 7

## 2011-03-13 LAB — DIFFERENTIAL
Basophils Absolute: 0
Basophils Absolute: 0
Basophils Absolute: 0.1
Basophils Relative: 0
Eosinophils Absolute: 0.2
Eosinophils Relative: 7 — ABNORMAL HIGH
Lymphocytes Relative: 19
Lymphocytes Relative: 36
Lymphocytes Relative: 24
Lymphs Abs: 1.2
Lymphs Abs: 1.4
Monocytes Absolute: 0.3
Monocytes Absolute: 0.1
Monocytes Relative: 9
Neutro Abs: 1.6 — ABNORMAL LOW
Neutro Abs: 4
Neutro Abs: 4.2
Neutrophils Relative %: 48

## 2011-03-13 LAB — BASIC METABOLIC PANEL
CO2: 29
Calcium: 8.5
GFR calc Af Amer: 59 — ABNORMAL LOW
GFR calc non Af Amer: 49 — ABNORMAL LOW
Potassium: 4
Sodium: 139

## 2011-03-13 LAB — CARDIAC PANEL(CRET KIN+CKTOT+MB+TROPI)
CK, MB: 0.9
CK, MB: 1.2
CK, MB: 2.4
Relative Index: 0.7
Relative Index: 0.9
Total CK: 135
Total CK: 135
Total CK: 209 — ABNORMAL HIGH
Troponin I: 0.02
Troponin I: 0.02

## 2011-03-13 LAB — URINE CULTURE
Colony Count: NO GROWTH
Special Requests: NEGATIVE

## 2011-04-03 ENCOUNTER — Other Ambulatory Visit: Payer: Self-pay | Admitting: Internal Medicine

## 2011-04-03 NOTE — Telephone Encounter (Signed)
Please advise if okay to refill. Thanks.  

## 2011-04-03 NOTE — Telephone Encounter (Signed)
OK to use with caution.

## 2011-04-05 ENCOUNTER — Other Ambulatory Visit: Payer: Self-pay | Admitting: Internal Medicine

## 2011-04-05 MED ORDER — ALBUTEROL SULFATE (2.5 MG/3ML) 0.083% IN NEBU: 2.5000 mg | INHALATION_SOLUTION | Freq: Four times a day (QID) | RESPIRATORY_TRACT | Status: DC | PRN

## 2011-04-28 ENCOUNTER — Other Ambulatory Visit: Payer: Self-pay | Admitting: Internal Medicine

## 2011-05-05 ENCOUNTER — Ambulatory Visit (INDEPENDENT_AMBULATORY_CARE_PROVIDER_SITE_OTHER): Payer: Medicare Other | Admitting: Internal Medicine

## 2011-05-05 ENCOUNTER — Encounter: Payer: Self-pay | Admitting: Internal Medicine

## 2011-05-05 VITALS — BP 120/72 | HR 81 | Ht 62.0 in | Wt 152.8 lb

## 2011-05-05 DIAGNOSIS — J479 Bronchiectasis, uncomplicated: Secondary | ICD-10-CM

## 2011-05-05 MED ORDER — ALBUTEROL SULFATE (2.5 MG/3ML) 0.083% IN NEBU: 2.5000 mg | INHALATION_SOLUTION | Freq: Four times a day (QID) | RESPIRATORY_TRACT | Status: DC | PRN

## 2011-05-05 MED ORDER — FLUTICASONE-SALMETEROL 100-50 MCG/DOSE IN AEPB: 1.0000 | INHALATION_SPRAY | Freq: Two times a day (BID) | RESPIRATORY_TRACT | Status: DC

## 2011-05-05 NOTE — Progress Notes (Signed)
Patient ID: [name], female    DOB: [birth year], 77 y.o.   MRN: [id number]  HPI 72 yoFnever smoker, here with daughter(pharmacist) , followed for asthma and dyspnea with complicating co-morbidities reviewed. Last here 05/13/11. Since then in last few weeks has been using neb up to twice daily. If used too late it will keep her up, but not noting palpitation. Cough more productive of white sputum. Twinges of pain around chest, but no anginal pains. Some wheeze. Feet swell.   02/02/11- 89 yoFnever smoker, here with daughter(pharmacist) , followed for asthma and dyspnea with complicating co-morbidities reviewed. Was hosp [facility] with pneumonia in mid-summer. She still doesn't feel fully recovered. Feels speech isn't quite as articulate, but was not told that she had a neurologic event..  CT 11/16/10- negative for clot or pneumonia, but postive for bronchitis, bronchiectasis.   05/05/11-  89 yoFnever smoker, here with daughter(pharmacist) , followed for asthma and dyspnea with complicating co-morbidities reviewed. She declines flu vaccine despite discussion. Daughter is here. She has done well since her August visit with no acute issues. Indoor heat now is increasing her cough, but daughter says cough has been better for the last 2 weeks, less productive. We discussed pulmonary toilet issues. CT Chest 11/16/10- We reviewed images together. IMPRESSION:  1. Chronic bronchitic changes.  2. Bronchiectasis at the bases.  3. Cardiomegaly without pulmonary edema.  4. Technically adequate exam showing no evidence for acute  pulmonary embolus.  5. Small hepatic cyst.  Original Report Authenticated By: Patterson Hammersmith, M.D.      Review of Systems See HPI Constitutional:   No weight loss, night sweats,  Fevers, chills, fatigue, lassitude. HEENT:   No headaches,  Difficulty swallowing,  Tooth/dental problems,  Sore throat,                No sneezing, itching, ear ache, nasal congestion, post nasal  drip,  CV: Orthopnea, PND, swelling in lower extremities, anasarca, dizziness, palpitations GI  No heartburn, indigestion, abdominal pain, nausea, vomiting, diarrhea, change in bowel habits, loss of appetite Resp:.   excess mucus ,productive cough,non-productive cough,  No coughing up of blood.  No change in color of mucus.  No wheezing.  Skin: no rash or lesions. GU: no dysuria, change in color of urine, no urgency or frequency.  No flank pain. MS:  No joint pain or swelling.  No decreased range of motion.  No back pain. Psych:  No change in mood or affect. No depression or anxiety.  No memory loss.  Objective:   Physical Exam General- Alert, Oriented, Affect-appropriate, Distress- none acute, using a walker. Overweight. Talkative. Skin- rash-none, lesions- none, excoriation- none Lymphadenopathy- none Head- atraumatic            Eyes- Gross vision intact, PERRLA, conjunctivae clear secretions            Ears- Hearing, canals-normal            Nose- Clear, no-Septal dev, mucus, polyps, erosion, perforation             Throat- Mallampati II , mucosa clear , drainage- none, tonsils- atrophic Neck- flexible , trachea midline, no stridor , thyroid nl, carotid no bruit Chest - symmetrical excursion , unlabored           Heart/CV- RRR , no murmur , no gallop  , no rub, nl s1 s2                           -  JVD- none , edema- 1-2+, stasis changes- none, varices- none           Lung- bilateral mild rhonchi/crackles - lower third of back, wheeze- none, +cough- with deep breath , dullness-none, rub- none           Chest wall-  Abd- tender-no, distended-no, bowel sounds-present, HSM- no Br/ Gen/ Rectal- Not done, not indicated Extrem- cyanosis- none, clubbing, none, atrophy- none, strength- nl Neuro- speech a little hesitant, not slurred

## 2011-05-05 NOTE — Patient Instructions (Signed)
Scripts sent for neb solution and Advair.  Scripts printed for neb solution and Advair for [employer]

## 2011-05-09 NOTE — Assessment & Plan Note (Signed)
I hear crackles and she has a cough with deep breath, consistent with a bronchitis/bronchiectasis pattern. The most likely explanation would be aspiration which we have discussed. She is not recognizing it. We're going to refill her medications as discussed today and watch over time.

## 2011-07-26 ENCOUNTER — Other Ambulatory Visit: Payer: Self-pay | Admitting: Internal Medicine

## 2011-07-27 ENCOUNTER — Telehealth: Payer: Self-pay | Admitting: Internal Medicine

## 2011-07-27 NOTE — Telephone Encounter (Signed)
Pt requesting a refill on her diazepam 5 mg 1 BID PRN. This was last refilled 04/03/11 #60 x 0 fills. Pt last seen nov 2012. Please advise Dr. [name], thanks

## 2011-07-27 NOTE — Telephone Encounter (Signed)
Per CY-ok to refill x 1; I have called pharmacy and gave verbal. Pt is aware of refill sent.

## 2011-09-04 ENCOUNTER — Ambulatory Visit (INDEPENDENT_AMBULATORY_CARE_PROVIDER_SITE_OTHER): Payer: Self-pay | Admitting: Internal Medicine

## 2011-09-04 ENCOUNTER — Encounter: Payer: Self-pay | Admitting: Internal Medicine

## 2011-09-04 VITALS — BP 108/60 | HR 65 | Ht 62.0 in | Wt 147.0 lb

## 2011-09-04 DIAGNOSIS — G4700 Insomnia, unspecified: Secondary | ICD-10-CM

## 2011-09-04 DIAGNOSIS — J479 Bronchiectasis, uncomplicated: Secondary | ICD-10-CM

## 2011-09-04 MED ORDER — DIAZEPAM 5 MG PO TABS: 5.0000 mg | ORAL_TABLET | Freq: Three times a day (TID) | ORAL | Status: DC | PRN

## 2011-09-04 NOTE — Patient Instructions (Signed)
Ok to stop Advair and see how you do without it.  Script to refill diazepam/ Valium for occasional use if needed. Be careful with this- it could over-sedate you

## 2011-09-04 NOTE — Progress Notes (Signed)
Patient ID: NOHELANI BENNING, female    DOB: 1918/06/06, 78 y.o.   MRN: [id number]  HPI 43 yoFnever smoker, here with daughter(pharmacist) , followed for asthma and dyspnea with complicating co-morbidities reviewed. Last here 05/13/11. Since then in last few weeks has been using neb up to twice daily. If used too late it will keep her up, but not noting palpitation. Cough more productive of white sputum. Twinges of pain around chest, but no anginal pains. Some wheeze. Feet swell.   02/02/11- 89 yoFnever smoker, here with daughter(pharmacist) , followed for asthma and dyspnea with complicating co-morbidities reviewed. Was hosp [facility] with pneumonia in mid-summer. She still doesn't feel fully recovered. Feels speech isn't quite as articulate, but was not told that she had a neurologic event..  CT 11/16/10- negative for clot or pneumonia, but postive for bronchitis, bronchiectasis.   05/05/11-  89 yoFnever smoker, here with daughter(pharmacist) , followed for asthma and dyspnea with complicating co-morbidities reviewed. She declines flu vaccine despite discussion. Daughter is here. She has done well since her August visit with no acute issues. Indoor heat now is increasing her cough, but daughter says cough has been better for the last 2 weeks, less productive. We discussed pulmonary toilet issues. CT Chest 11/16/10- We reviewed images together. IMPRESSION:  1. Chronic bronchitic changes.  2. Bronchiectasis at the bases.  3. Cardiomegaly without pulmonary edema.  4. Technically adequate exam showing no evidence for acute  pulmonary embolus.  5. Small hepatic cyst.  Original Report Authenticated By: Patterson Hammersmith, M.D.   09/04/11- 57 yoF never smoker, here with daughter(pharmacist) , followed for asthma and dyspnea with complicating co-morbidities reviewed. She has a "black eye" after recent eyelid surgery. During the winter she had one spell of coughing for 2 weeks but that resolved without  treatment. She would like to stop Advair now because it is too difficult for her to use. Daughter says technique is poor. Little cough or sputum. Has a nebulizer machine she uses for rescue if necessary. Asks that I refill diazepam for occasional use as discussed. I emphasized issues of geriatric use which her daughter could appreciate.  Review of Systems-See HPI Constitutional:   No weight loss, night sweats,  Fevers, chills, fatigue, lassitude. HEENT:   No headaches,  Difficulty swallowing,  Tooth/dental problems,  Sore throat,                No sneezing, itching, ear ache, nasal congestion, post nasal drip,  CV: Orthopnea, PND, swelling in lower extremities, anasarca, dizziness, palpitations GI  No heartburn, indigestion, abdominal pain, nausea, vomiting, diarrhea, change in bowel habits, loss of appetite Resp:.   No-excess mucus ,productive cough,non-productive cough,  No coughing up of blood.  No change in color of mucus.  No wheezing.  Skin: no rash or lesions. GU: . MS:  No joint pain or swelling.   Psych:  No change in mood or affect. No depression, some anxiety.  No memory loss.  Objective:   Physical Exam General- Alert, Oriented, Affect-appropriate, Distress- none acute, using a walker.  Talkative. Skin- rash-none, lesions- none, excoriation- none Lymphadenopathy- none Head- atraumatic            Eyes- Gross vision intact, PERRLA, conjunctivae clear secretions            Ears- Hearing, canals-normal            Nose- Clear, no-Septal dev, mucus, polyps, erosion, perforation  Throat- Mallampati II , mucosa clear , drainage- none, tonsils- atrophic Neck- flexible , trachea midline, no stridor , thyroid nl, carotid no bruit Chest - symmetrical excursion , unlabored           Heart/CV- RRR , no murmur , no gallop  , no rub, nl s1 s2                           - JVD- none , edema- 1-2+, stasis changes- none, varices- none           Lung- +bilateral mild rhonchi/crackles -  lower third of back, wheeze- none, +cough- with deep breath , dullness-none, rub- none           Chest wall-  Abd-  Br/ Gen/ Rectal- Not done, not indicated Extrem- cyanosis- none, clubbing, none, atrophy- none, strength- nl, using a walker Neuro- speech a little hesitant, not slurred

## 2011-09-11 NOTE — Assessment & Plan Note (Signed)
Occasional use of Valium seems helpful and well tolerated. Her daughter is a Teacher, early years/pre and participated in this discussion and consideration.

## 2011-09-11 NOTE — Assessment & Plan Note (Signed)
Symptoms are stable. She had one episode of acute bronchitis during the winter which resolved without treatment. Her nebulizer machine seems sufficient for occasional use, less than once per week. We are going to let her try without Advair since her technique was poor and she saw no benefit. If necessary we will consider a metered inhaler.

## 2012-01-02 ENCOUNTER — Telehealth: Payer: Self-pay | Admitting: Internal Medicine

## 2012-01-02 NOTE — Telephone Encounter (Signed)
I spoke with the pt and she wants [place] to call Dr. Linda Hedges office and request records from last OV to be faxed over so Dr. [name] can review these. Pt states that did labs and xray that day but she has not heard any results and she wants Dr. [name] to look over everything and let her know what he thinks. Will await fax. Carron Curie, CMA

## 2012-01-03 NOTE — Telephone Encounter (Signed)
No fax on triage fax #. Will forward to [facility] to see if she receives the results and if not so can call for results.

## 2012-01-03 NOTE — Telephone Encounter (Signed)
Fax received and placed on Dr. [name] look-at to review. Carron Curie, CMA

## 2012-01-03 NOTE — Telephone Encounter (Signed)
I looked through the labs from Dr Linda Hedges office. I didn't spot any significant abnormalities.

## 2012-01-04 NOTE — Telephone Encounter (Signed)
Called, spoke with pt.  I informed her of below per Dr. [name].  She verbalized understanding of this, was very appreciative of Dr. [name] doing this, and voiced no further questions/concerns at this time.

## 2012-01-10 ENCOUNTER — Telehealth: Payer: Self-pay | Admitting: Internal Medicine

## 2012-01-10 MED ORDER — PREDNISONE 10 MG PO TABS: ORAL_TABLET | ORAL | Status: DC

## 2012-01-10 MED ORDER — ALBUTEROL SULFATE (2.5 MG/3ML) 0.083% IN NEBU: 2.5000 mg | INHALATION_SOLUTION | Freq: Four times a day (QID) | RESPIRATORY_TRACT | Status: DC | PRN

## 2012-01-10 NOTE — Telephone Encounter (Signed)
Per CY-okay to give patient Prednisone 10 mg #20 take 4 x 2 days, 3 x 2 days, 2 x 2 days, 1 x 2 days, then stop no refills.  

## 2012-01-10 NOTE — Telephone Encounter (Signed)
I spoke with pt and she states her asthma is acting. She c/o increase SOB, cough, chest tx and wheezing. She is requesting prednisone be called in for her. Also she needs refill on her albuterol neb (I have sent rx in for this). Please advise Dr. [name] thanks  Allergies  Allergen Reactions  . Codeine   . Hydrocod Polst-Cpm Polst Er   . Hydrocodone   . Hydromorphone Hcl     REACTION: itching  . Morphine     REACTION: itching

## 2012-01-10 NOTE — Telephone Encounter (Signed)
Pt aware rx has been sent to the pharmacy and is aware of directions. Nothing further was needed

## 2012-02-26 ENCOUNTER — Telehealth: Payer: Self-pay | Admitting: Internal Medicine

## 2012-02-26 DIAGNOSIS — J45909 Unspecified asthma, uncomplicated: Secondary | ICD-10-CM

## 2012-02-26 DIAGNOSIS — R0602 Shortness of breath: Secondary | ICD-10-CM

## 2012-02-26 NOTE — Telephone Encounter (Signed)
I spoke with the pt daughter and she states the pt neb machine stopped working over the weekend. She states it was old. They are requesting an order be sent for a new machine. Pt daughter requests that she be contacted instead of the patient. I placed this info in the order.  Order placed. Carron Curie, CMA

## 2012-03-05 ENCOUNTER — Ambulatory Visit (INDEPENDENT_AMBULATORY_CARE_PROVIDER_SITE_OTHER): Payer: Medicare Other | Admitting: Internal Medicine

## 2012-03-05 ENCOUNTER — Encounter: Payer: Self-pay | Admitting: Internal Medicine

## 2012-03-05 ENCOUNTER — Ambulatory Visit (INDEPENDENT_AMBULATORY_CARE_PROVIDER_SITE_OTHER)
Admission: RE | Admit: 2012-03-05 | Discharge: 2012-03-05 | Disposition: A | Discharge status: Home / Self Care | Payer: Medicare Other | Source: Ambulatory Visit | Attending: Internal Medicine | Admitting: Internal Medicine

## 2012-03-05 VITALS — BP 110/66 | HR 76 | Ht 62.0 in | Wt 136.8 lb

## 2012-03-05 DIAGNOSIS — J479 Bronchiectasis, uncomplicated: Secondary | ICD-10-CM

## 2012-03-05 NOTE — Patient Instructions (Addendum)
Order- CXR  Dx bronchiectasis  Ok to continue present meds.

## 2012-03-05 NOTE — Progress Notes (Signed)
Patient ID: [name], female    DOB: 1918-08-30, 78 y.o.   MRN: [id number]  HPI 41 yoFnever smoker, here with daughter(pharmacist) , followed for asthma and dyspnea with complicating co-morbidities reviewed. Last here 05/13/11. Since then in last few weeks has been using neb up to twice daily. If used too late it will keep her up, but not noting palpitation. Cough more productive of white sputum. Twinges of pain around chest, but no anginal pains. Some wheeze. Feet swell.   02/02/11- 89 yoFnever smoker, here with daughter(pharmacist) , followed for asthma and dyspnea with complicating co-morbidities reviewed. Was hosp [facility] with pneumonia in mid-summer. She still doesn't feel fully recovered. Feels speech isn't quite as articulate, but was not told that she had a neurologic event..  CT 11/16/10- negative for clot or pneumonia, but postive for bronchitis, bronchiectasis.   05/05/11-  89 yoFnever smoker, here with daughter(pharmacist) , followed for asthma and dyspnea with complicating co-morbidities reviewed. She declines flu vaccine despite discussion. Daughter is here. She has done well since her August visit with no acute issues. Indoor heat now is increasing her cough, but daughter says cough has been better for the last 2 weeks, less productive. We discussed pulmonary toilet issues. CT Chest 11/16/10- We reviewed images together. IMPRESSION:  1. Chronic bronchitic changes.  2. Bronchiectasis at the bases.  3. Cardiomegaly without pulmonary edema.  4. Technically adequate exam showing no evidence for acute  pulmonary embolus.  5. Small hepatic cyst.  Original Report Authenticated By: Patterson Hammersmith, M.D.   09/04/11- 67 yoF never smoker, here with daughter(pharmacist) , followed for asthma and dyspnea with complicating co-morbidities reviewed. She has a "black eye" after recent eyelid surgery. During the winter she had one spell of coughing for 2 weeks but that resolved without  treatment. She would like to stop Advair now because it is too difficult for her to use. Daughter says technique is poor. Little cough or sputum. Has a nebulizer machine she uses for rescue if necessary. Asks that I refill diazepam for occasional use as discussed. I emphasized issues of geriatric use which her daughter could appreciate.  03/05/12- 89 yoF never smoker, here with daughter(pharmacist) , followed for asthma and dyspnea with complicating co-morbidities reviewed. Occasional cough, more days than not, but not enough that it bothers her. Dry, no pain or blood, wheeze or dyspnea. Never uses rescue inhaler. Uses neb 3x/ day- well tolerated. Cites hx reaction to flu vax- refuses.   Review of Systems-See HPI Constitutional:   No weight loss, night sweats, fevers, chills, fatigue, lassitude. HEENT:   No headaches,  Difficulty swallowing,  Tooth/dental problems,  Sore throat,                No sneezing, itching, ear ache, nasal congestion, post nasal drip,  CV: No-Orthopnea, PND, swelling in lower extremities, anasarca, dizziness, palpitations GI  No heartburn, indigestion, abdominal pain, nausea, vomiting, diarrhea, change in bowel habits, loss of appetite Resp:.   No-excess mucus ,productive cough, + non-productive cough,  No coughing up of blood.  No change in color of mucus.  No wheezing.  Skin: no rash or lesions. GU: . MS:  No joint pain or swelling.   Psych:  No change in mood or affect. No depression, some anxiety.  No memory loss.  Objective:   Physical Exam General- Alert, Oriented, Affect-appropriate, Distress- none acute, using a walker.  Talkative. Elderly Skin- rash-none, lesions- none, excoriation- none Lymphadenopathy- none Head- atraumatic  Eyes- Gross vision intact, PERRLA, conjunctivae clear secretions            Ears- Hearing, canals-normal            Nose- Clear, no-Septal dev, mucus, polyps, erosion, perforation             Throat- Mallampati II , mucosa  clear , drainage- none, tonsils- atrophic Neck- flexible , trachea midline, no stridor , thyroid nl, carotid no bruit Chest - symmetrical excursion , unlabored           Heart/CV- RRR , no murmur , no gallop  , no rub, nl s1 s2                           - JVD- none , edema- 1-2+, stasis changes- none, varices- none           Lung- +bilateral mild rhonchi/crackles - lower third of back, wheeze- none, + raspy/ dry cough- with deep breath , dullness-none, rub- none           Chest wall-  Abd-  Br/ Gen/ Rectal- Not done, not indicated Extrem- cyanosis- none, clubbing, none, atrophy- none, strength- nl, using a walker Neuro- speech a little hesitant, not slurred

## 2012-03-08 NOTE — Progress Notes (Signed)
Quick Note:  Spoke with patients daughter Kara Pacer of results. ______

## 2012-03-12 ENCOUNTER — Telehealth: Payer: Self-pay | Admitting: Internal Medicine

## 2012-03-12 NOTE — Telephone Encounter (Signed)
Spoke with pt and notified of results of her las cxr per her request. She verbalized understanding and states nothing further needed.

## 2012-03-14 NOTE — Assessment & Plan Note (Signed)
Chronic bronchiectasis. I can't tell that there is real change, but watch for exacerbation. She is very old and getting frail. Expectations are realistic.  Plan - CXR

## 2012-04-16 ENCOUNTER — Telehealth: Payer: Self-pay | Admitting: Internal Medicine

## 2012-04-17 NOTE — Telephone Encounter (Signed)
Called spoke with patient's daughter who stated that pt lives at a SNF and someone noticed her DOE and recommended she try O2 b/c it may help.  [name] also stated that pt informed her this morning that she has changed her mind and does not feel that she needs O2 now.  Per [name], this happens "every once in a while."  Nothing further needed per [name]; will sign off and forward to CY as FYI.

## 2012-04-17 NOTE — Telephone Encounter (Signed)
CY, please advise if the patient should have O2 as I do not see anything in most recent OV notes about O2. Thanks.

## 2012-04-17 NOTE — Telephone Encounter (Signed)
We don't have documentation now that she qualifies for O2 by Medicare rules. Why is daughter calling for this?

## 2012-05-20 ENCOUNTER — Telehealth: Payer: Self-pay | Admitting: Internal Medicine

## 2012-05-20 NOTE — Telephone Encounter (Signed)
Spoke with pt She states that she is having trouble with neb tubing She kept stating that her neb tubing is too long and too much of it I called the daughter to who helps take care of her to see if I can get a better idea of how to help her She states that she is going to see her tonight, and she will see what the problem is She will call us tomorrow if there is something needed from [place], and states that if she does not hear from [place] we can sign the msg

## 2012-05-22 ENCOUNTER — Telehealth: Payer: Self-pay | Admitting: Internal Medicine

## 2012-05-22 MED ORDER — ALBUTEROL SULFATE (2.5 MG/3ML) 0.083% IN NEBU: 2.5000 mg | INHALATION_SOLUTION | Freq: Four times a day (QID) | RESPIRATORY_TRACT | Status: DC | PRN

## 2012-05-22 NOTE — Telephone Encounter (Signed)
Rx has been sent in. 

## 2012-06-04 ENCOUNTER — Other Ambulatory Visit: Payer: Self-pay | Admitting: *Deleted

## 2012-06-04 MED ORDER — DIAZEPAM 5 MG PO TABS: 5.0000 mg | ORAL_TABLET | Freq: Three times a day (TID) | ORAL | Status: DC | PRN

## 2012-08-12 ENCOUNTER — Telehealth: Payer: Self-pay | Admitting: Internal Medicine

## 2012-08-12 ENCOUNTER — Encounter: Payer: Self-pay | Admitting: Internal Medicine

## 2012-08-12 ENCOUNTER — Ambulatory Visit (INDEPENDENT_AMBULATORY_CARE_PROVIDER_SITE_OTHER)
Admission: RE | Admit: 2012-08-12 | Discharge: 2012-08-12 | Disposition: A | Discharge status: Home / Self Care | Payer: Medicare Other | Source: Ambulatory Visit | Attending: Internal Medicine | Admitting: Internal Medicine

## 2012-08-12 ENCOUNTER — Ambulatory Visit (INDEPENDENT_AMBULATORY_CARE_PROVIDER_SITE_OTHER): Payer: Medicare Other | Admitting: Internal Medicine

## 2012-08-12 ENCOUNTER — Other Ambulatory Visit (INDEPENDENT_AMBULATORY_CARE_PROVIDER_SITE_OTHER): Payer: Medicare Other

## 2012-08-12 VITALS — BP 110/58 | HR 79 | Ht 62.0 in | Wt 136.8 lb

## 2012-08-12 DIAGNOSIS — J209 Acute bronchitis, unspecified: Secondary | ICD-10-CM

## 2012-08-12 LAB — CBC WITH DIFFERENTIAL/PLATELET
Eosinophils Relative: 0.4 % (ref 0.0–5.0)
HCT: 36.6 % (ref 36.0–46.0)
Hemoglobin: 12.3 g/dL (ref 12.0–15.0)
Lymphs Abs: 1.2 10*3/uL (ref 0.7–4.0)
MCV: 84.1 fl (ref 78.0–100.0)
Monocytes Absolute: 0.6 10*3/uL (ref 0.1–1.0)
Neutro Abs: 6.1 10*3/uL (ref 1.4–7.7)
Platelets: 109 10*3/uL — ABNORMAL LOW (ref 150.0–400.0)
RDW: 14.1 % (ref 11.5–14.6)

## 2012-08-12 LAB — BASIC METABOLIC PANEL
Calcium: 9 mg/dL (ref 8.4–10.5)
Creatinine, Ser: 1.1 mg/dL (ref 0.4–1.2)
GFR: 51.04 mL/min — ABNORMAL LOW (ref >60.00)
Glucose, Bld: 95 mg/dL (ref 70–99)
Sodium: 138 mEq/L (ref 135–145)

## 2012-08-12 MED ORDER — AMOXICILLIN-POT CLAVULANATE 500-125 MG PO TABS: ORAL_TABLET | ORAL | Status: DC

## 2012-08-12 MED ORDER — LEVALBUTEROL HCL 0.63 MG/3ML IN NEBU
0.6300 mg | INHALATION_SOLUTION | Freq: Once | RESPIRATORY_TRACT | Status: AC
  Administered 2012-08-12: 0.63 mg via RESPIRATORY_TRACT

## 2012-08-12 MED ORDER — BENZONATATE 100 MG PO CAPS: 100.0000 mg | ORAL_CAPSULE | Freq: Four times a day (QID) | ORAL | Status: DC | PRN

## 2012-08-12 MED ORDER — METHYLPREDNISOLONE ACETATE 80 MG/ML IJ SUSP
80.0000 mg | Freq: Once | INTRAMUSCULAR | Status: AC
  Administered 2012-08-12: 80 mg via INTRAMUSCULAR

## 2012-08-12 NOTE — Telephone Encounter (Signed)
I spoke with daughter and pt is scheduled to come in and see CDY today at 10:30 for acute visit. Nothing further was needed

## 2012-08-12 NOTE — Progress Notes (Signed)
Patient ID: ALEA RYER, female    DOB: 1919/04/06, 79 y.o.   MRN: [id number]  HPI 72 yoFnever smoker, here with daughter(pharmacist) , followed for asthma and dyspnea with complicating co-morbidities reviewed. Last here 05/13/11. Since then in last few weeks has been using neb up to twice daily. If used too late it will keep her up, but not noting palpitation. Cough more productive of white sputum. Twinges of pain around chest, but no anginal pains. Some wheeze. Feet swell.   02/02/11- 89 yoFnever smoker, here with daughter(pharmacist) , followed for asthma and dyspnea with complicating co-morbidities reviewed. Was hosp [facility] with pneumonia in mid-summer. She still doesn't feel fully recovered. Feels speech isn't quite as articulate, but was not told that she had a neurologic event..  CT 11/16/10- negative for clot or pneumonia, but postive for bronchitis, bronchiectasis.   05/05/11-  89 yoFnever smoker, here with daughter(pharmacist) , followed for asthma and dyspnea with complicating co-morbidities reviewed. She declines flu vaccine despite discussion. Daughter is here. She has done well since her August visit with no acute issues. Indoor heat now is increasing her cough, but daughter says cough has been better for the last 2 weeks, less productive. We discussed pulmonary toilet issues. CT Chest 11/16/10- We reviewed images together. IMPRESSION:  1. Chronic bronchitic changes.  2. Bronchiectasis at the bases.  3. Cardiomegaly without pulmonary edema.  4. Technically adequate exam showing no evidence for acute  pulmonary embolus.  5. Small hepatic cyst.  Original Report Authenticated By: Patterson Hammersmith, M.D.   09/04/11- 32 yoF never smoker, here with daughter(pharmacist) , followed for asthma and dyspnea with complicating co-morbidities reviewed. She has a "black eye" after recent eyelid surgery. During the winter she had one spell of coughing for 2 weeks but that resolved without  treatment. She would like to stop Advair now because it is too difficult for her to use. Daughter says technique is poor. Little cough or sputum. Has a nebulizer machine she uses for rescue if necessary. Asks that I refill diazepam for occasional use as discussed. I emphasized issues of geriatric use which her daughter could appreciate.  03/05/12- 89 yoF never smoker, here with daughter(pharmacist) , followed for asthma and dyspnea with complicating co-morbidities reviewed. Occasional cough, more days than not, but not enough that it bothers her. Dry, no pain or blood, wheeze or dyspnea. Never uses rescue inhaler. Uses neb 3x/ day- well tolerated. Cites hx reaction to flu vax- refuses.   08/12/12- 89 yoF never smoker, here with daughter(pharmacist) , followed for asthma and dyspnea with complicating co-morbidities reviewed.    ACUTE VISIT: increased cough-productive-gray in color since Wednesday, SOB and wheezing as well. Denies any chills.  Using nebulizer about TID now. Increased cough for one week as described. Denies fever, swollen glands purulent sputum, chest pain or symptoms suggesting non-respiratory infection. CXR 03/08/12- IMPRESSION:  Chronic changes as above. No acute findings.  Original Report Authenticated By: Cyndie Chime, M.D.  Review of Systems-See HPI Constitutional:   No weight loss, night sweats, fevers, chills, fatigue, lassitude. HEENT:   No headaches,  Difficulty swallowing,  Tooth/dental problems,  Sore throat,                No sneezing, itching, ear ache, nasal congestion, post nasal drip,  CV: No-Orthopnea, PND, swelling in lower extremities, anasarca, dizziness, palpitations GI  No heartburn, indigestion, abdominal pain, nausea, vomiting, diarrhea, change in bowel habits, loss of appetite Resp:.   +excess mucus ,  productive cough, + non-productive cough,  No coughing up of blood.  No change in color of mucus.  No wheezing.  Skin: no rash or lesions. GU: . MS:  No  joint pain or swelling.   Psych:  No change in mood or affect. No depression, some anxiety.  No memory loss.  Objective:   Physical Exam General- Alert, Oriented, Affect-appropriate, Distress- none acute, using a walker.  Looks tired.  Elderly. Wheelchair, room air. Skin- rash-none, lesions- none, excoriation- none Lymphadenopathy- none Head- atraumatic            Eyes- Gross vision intact, PERRLA, conjunctivae clear secretions            Ears- Hearing, canals-normal            Nose- Clear, no-Septal dev, mucus, polyps, erosion, perforation             Throat- Mallampati II , mucosa clear , drainage- none, tonsils- atrophic Neck- flexible , trachea midline, no stridor , thyroid nl, carotid no bruit Chest - symmetrical excursion , unlabored           Heart/CV- RRR , no murmur , no gallop  , no rub, nl s1 s2                           - JVD- none , edema- 1-2+, stasis changes- none, varices- none           Lung- +bilateral mild rhonchi/crackles - lower third of back, wheeze- none, + raspy/ dry cough- with deep breath , dullness-none, rub- none           Chest wall-  Abd-  Br/ Gen/ Rectal- Not done, not indicated Extrem- cyanosis- none, clubbing, none, atrophy- none, strength- nl, using a walker Neuro- speech a little hesitant, not slurred

## 2012-08-12 NOTE — Patient Instructions (Addendum)
Order- CXR   Dx acute bronchitis   We will call report to daughter  Script for augmentin sent  Script for benzonatate perles   Order lab- CBC, BMET    Dx acute bronchitis  Neb xop  0.63  Depo 80  Ok to use mucinex to loosen mucus if needed

## 2012-08-13 ENCOUNTER — Telehealth: Payer: Self-pay | Admitting: Internal Medicine

## 2012-08-13 ENCOUNTER — Other Ambulatory Visit: Payer: Self-pay | Admitting: Internal Medicine

## 2012-08-13 NOTE — Progress Notes (Signed)
Quick Note:  Daughter aware of results. Will come by here in 1 month (per CY) to have rechecked as patient does not have a PCP at this time. ______

## 2012-08-13 NOTE — Telephone Encounter (Signed)
Spoke with Martha(pt's daughter); aware of results and will bring patient back by in 1 month to have repeat CBC diff checked for low platelet count.

## 2012-08-13 NOTE — Progress Notes (Signed)
Quick Note:  Daughter aware of results. ______

## 2012-08-13 NOTE — Progress Notes (Signed)
Quick Note:  LMTCB at daughters phone number as they requested at visit. ______

## 2012-08-13 NOTE — Progress Notes (Signed)
Quick Note:  LMTCB at daughters phone number as they requested at visit. ______ 

## 2012-08-15 NOTE — Assessment & Plan Note (Signed)
Acute exacerbation with bronchitis. Plan-Augmentin, lab for CBC, chemistry, chest x-ray

## 2012-08-18 IMAGING — CR DG FEMUR 2+V*R*
4 series · 4 of 4 positions shown · non-contrast
Comparison: None.

CLINICAL DATA: Right upper leg pain.

RIGHT FEMUR - 2 VIEW

[t femur with hip  ap right]
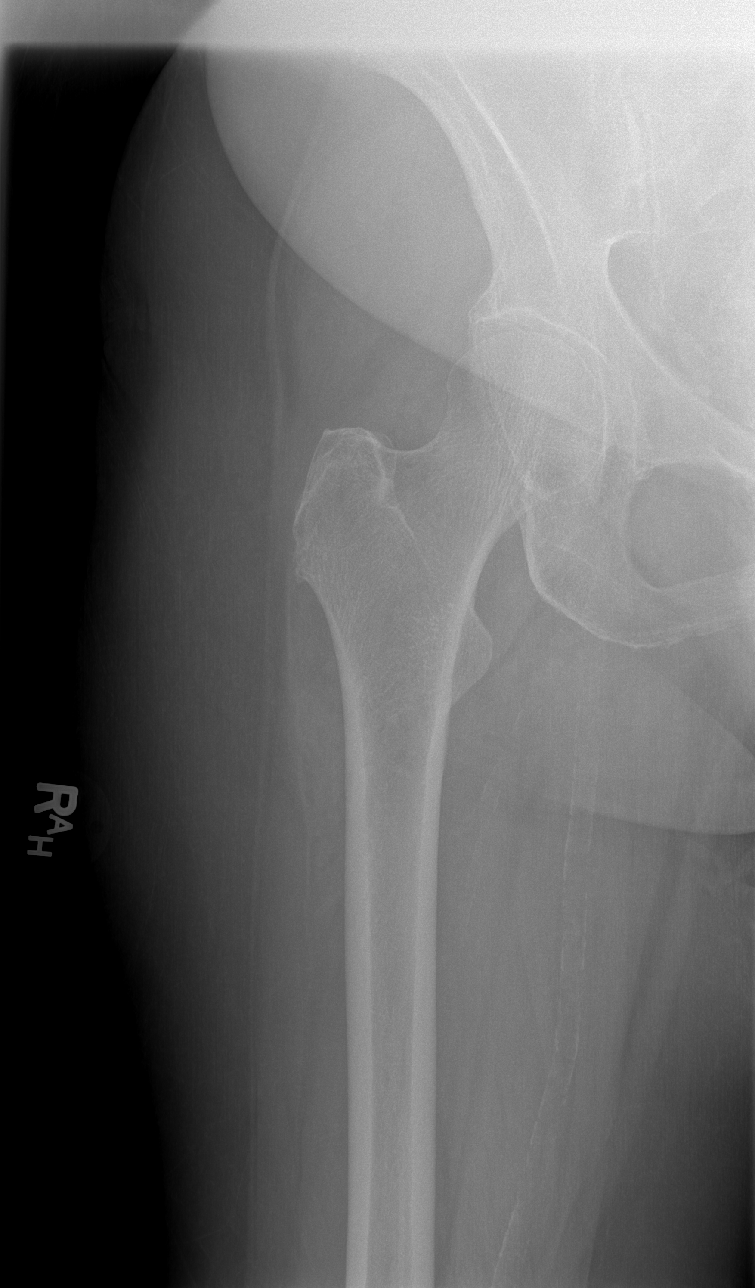

[t femur with knee ap right]
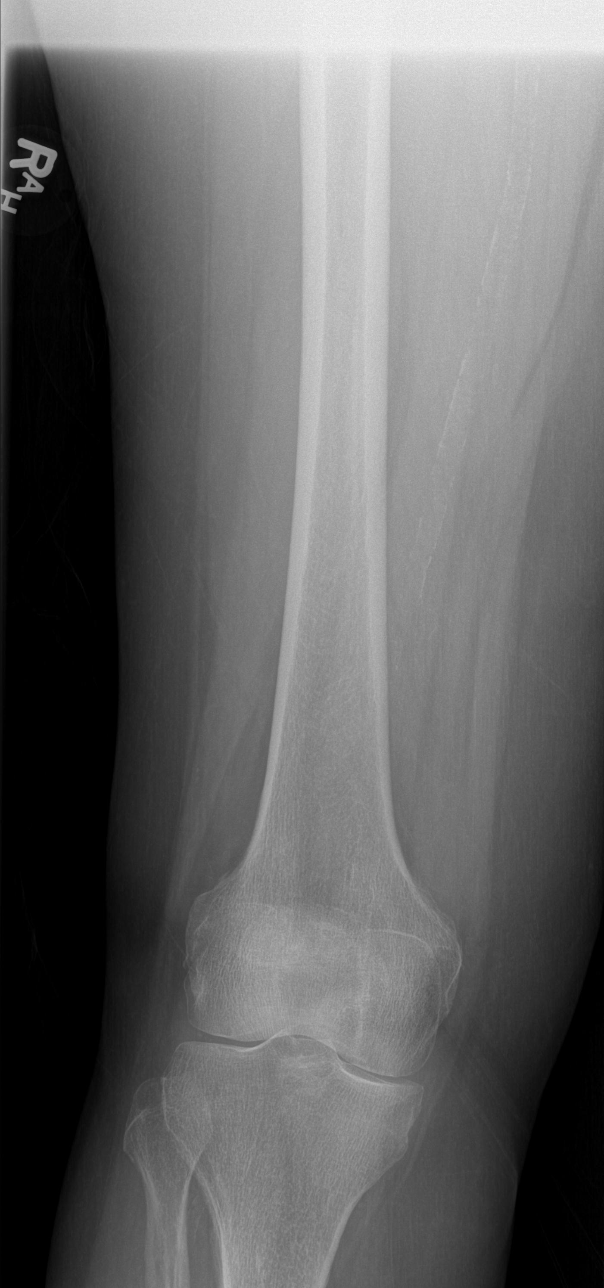

[t femur with knee lat right (1 of 2)]
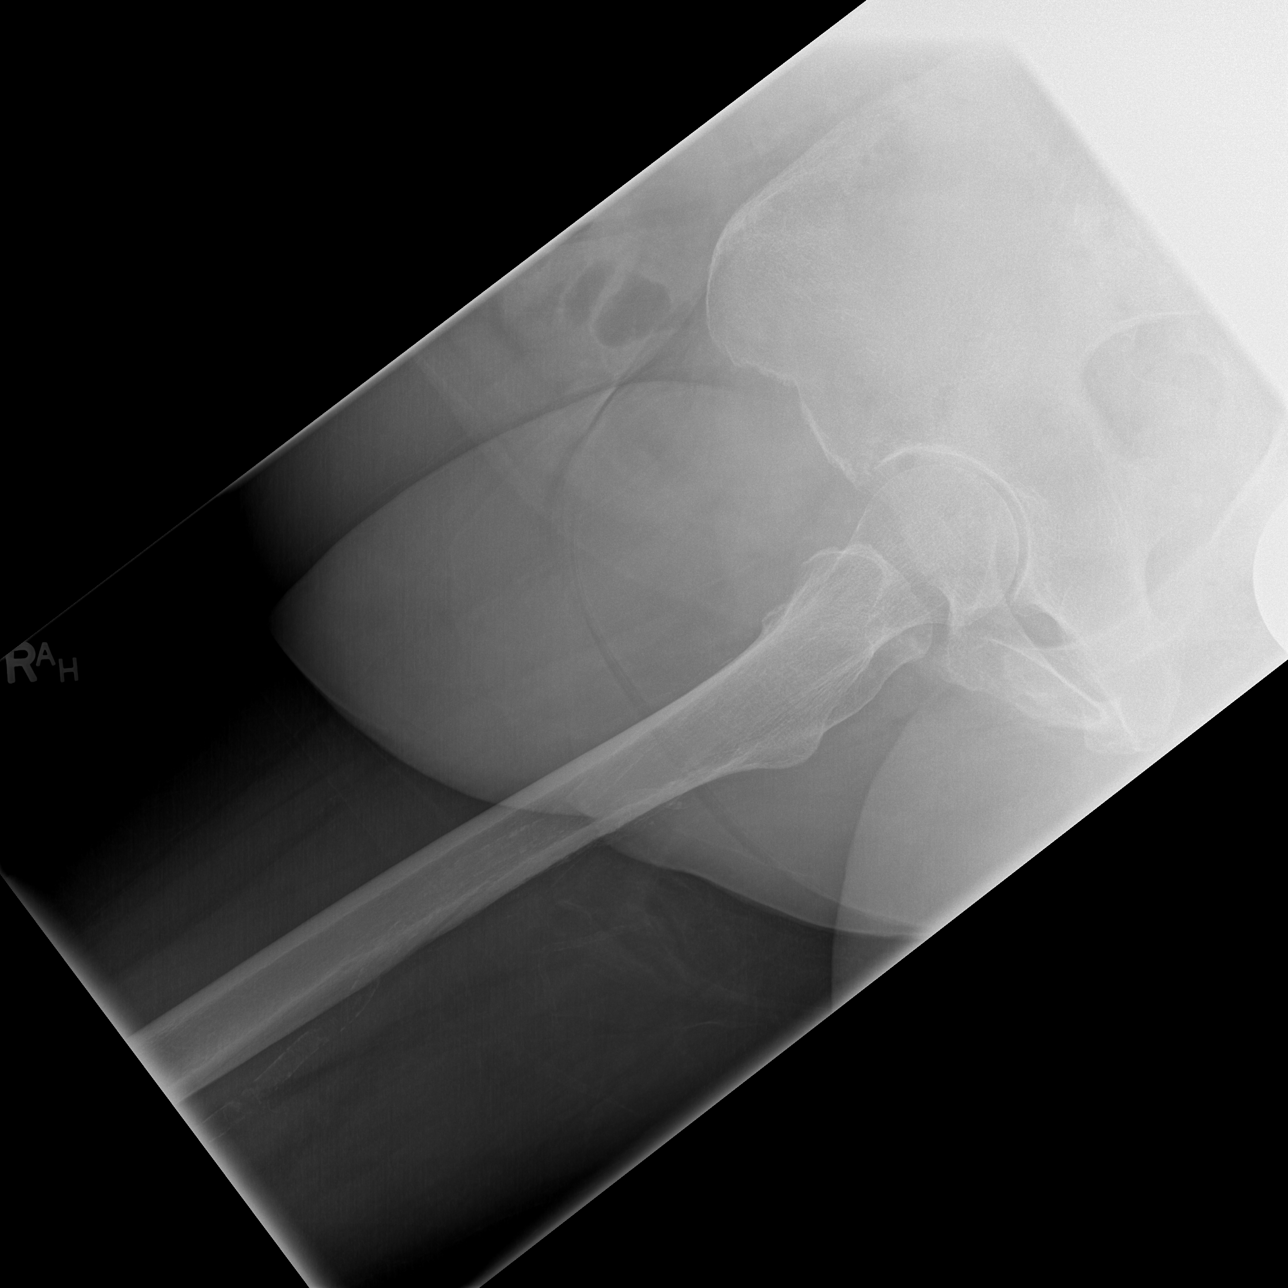

[t femur with knee lat right (2 of 2)]
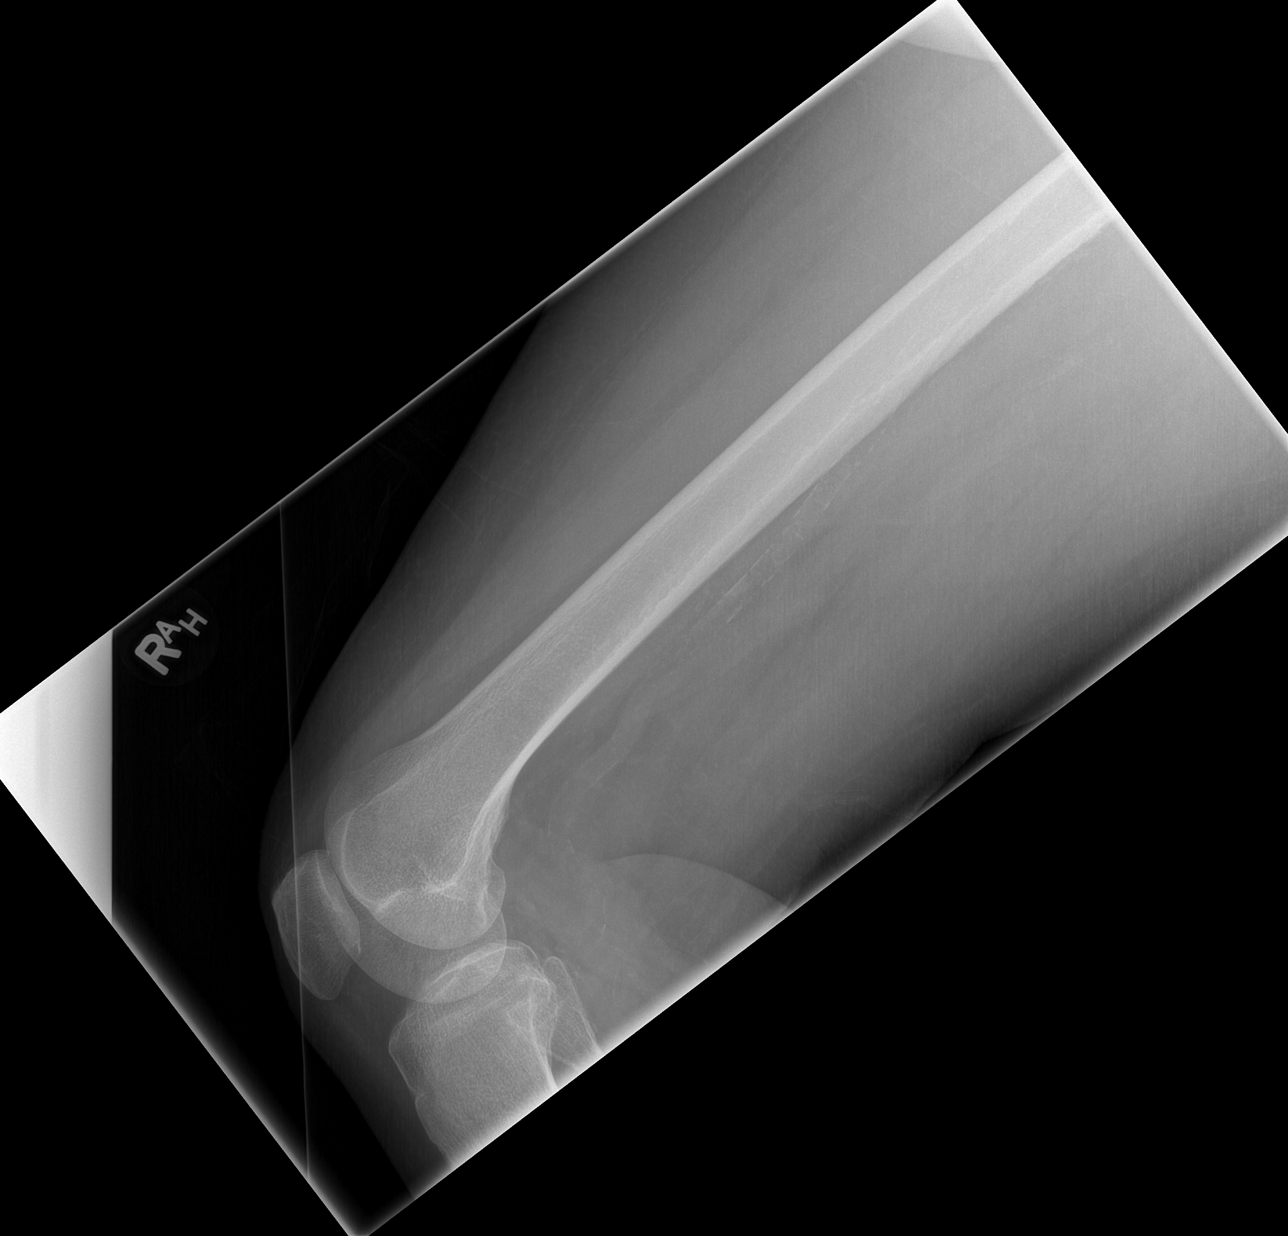

[4 of 4 positions shown; findings below may reference images not displayed]

FINDINGS: There is no fracture or dislocation.  Mild degenerative
change about the hip and knee noted.
IMPRESSION: No acute finding.

## 2012-11-07 ENCOUNTER — Encounter: Payer: Self-pay | Admitting: Internal Medicine

## 2012-11-07 ENCOUNTER — Ambulatory Visit (INDEPENDENT_AMBULATORY_CARE_PROVIDER_SITE_OTHER): Payer: Medicare Other | Admitting: Internal Medicine

## 2012-11-07 VITALS — BP 120/76 | HR 68 | Ht 62.0 in | Wt 136.8 lb

## 2012-11-07 DIAGNOSIS — J479 Bronchiectasis, uncomplicated: Secondary | ICD-10-CM

## 2012-11-07 MED ORDER — FLUTICASONE FUROATE-VILANTEROL 100-25 MCG/INH IN AEPB: 1.0000 | INHALATION_SPRAY | Freq: Every day | RESPIRATORY_TRACT | Status: DC

## 2012-11-07 NOTE — Progress Notes (Signed)
Patient ID: [name], female    DOB: 03/06/1919, 79 y.o.   MRN: [id number]  HPI 36 yoFnever smoker, here with daughter(pharmacist) , followed for asthma and dyspnea with complicating co-morbidities reviewed. Last here 05/13/11. Since then in last few weeks has been using neb up to twice daily. If used too late it will keep her up, but not noting palpitation. Cough more productive of white sputum. Twinges of pain around chest, but no anginal pains. Some wheeze. Feet swell.   02/02/11- 89 yoFnever smoker, here with daughter(pharmacist) , followed for asthma and dyspnea with complicating co-morbidities reviewed. Was hosp [facility] with pneumonia in mid-summer. She still doesn't feel fully recovered. Feels speech isn't quite as articulate, but was not told that she had a neurologic event..  CT 11/16/10- negative for clot or pneumonia, but postive for bronchitis, bronchiectasis.   05/05/11-  89 yoFnever smoker, here with daughter(pharmacist) , followed for asthma and dyspnea with complicating co-morbidities reviewed. She declines flu vaccine despite discussion. Daughter is here. She has done well since her August visit with no acute issues. Indoor heat now is increasing her cough, but daughter says cough has been better for the last 2 weeks, less productive. We discussed pulmonary toilet issues. CT Chest 11/16/10- We reviewed images together. IMPRESSION:  1. Chronic bronchitic changes.  2. Bronchiectasis at the bases.  3. Cardiomegaly without pulmonary edema.  4. Technically adequate exam showing no evidence for acute  pulmonary embolus.  5. Small hepatic cyst.  Original Report Authenticated By: Patterson Hammersmith, M.D.   09/04/11- 47 yoF never smoker, here with daughter(pharmacist) , followed for asthma and dyspnea with complicating co-morbidities reviewed. She has a "black eye" after recent eyelid surgery. During the winter she had one spell of coughing for 2 weeks but that resolved without  treatment. She would like to stop Advair now because it is too difficult for her to use. Daughter says technique is poor. Little cough or sputum. Has a nebulizer machine she uses for rescue if necessary. Asks that I refill diazepam for occasional use as discussed. I emphasized issues of geriatric use which her daughter could appreciate.  03/05/12- 89 yoF never smoker, here with daughter(pharmacist) , followed for asthma and dyspnea with complicating co-morbidities reviewed. Occasional cough, more days than not, but not enough that it bothers her. Dry, no pain or blood, wheeze or dyspnea. Never uses rescue inhaler. Uses neb 3x/ day- well tolerated. Cites hx reaction to flu vax- refuses.   08/12/12- 89 yoF never smoker, here with daughter(pharmacist) , followed for asthma and dyspnea with complicating co-morbidities reviewed.    ACUTE VISIT: increased cough-productive-gray in color since Wednesday, SOB and wheezing as well. Denies any chills.  Using nebulizer about TID now. Increased cough for one week as described. Denies fever, swollen glands purulent sputum, chest pain or symptoms suggesting non-respiratory infection. CXR 03/08/12- IMPRESSION:  Chronic changes as above. No acute findings.  Original Report Authenticated By: Cyndie Chime, M.D.  11/07/12- 2 yoF never smoker, here with daughter(pharmacist) , followed for asthma and dyspnea with complicating co-morbidities reviewed.  ACUTE VISIT: SOB getting worse since last visit-worse with activity; unsure if happening when lying down. Gradually worsening dyspnea with exertion. Cough productive scant white sputum. No wheeze. Using nebulizer more. CXR 08/13/12 IMPRESSION:  Stable COPD and chronic changes. No acute cardiopulmonary disease.  Original Report Authenticated By: Charlett Nose, M.D.  Review of Systems-See HPI Constitutional:   No weight loss, night sweats, fevers, chills, fatigue, lassitude.  HEENT:   No headaches,  Difficulty swallowing,   Tooth/dental problems,  Sore throat,                No sneezing, itching, ear ache, nasal congestion, post nasal drip,  CV: No-Orthopnea, PND, swelling in lower extremities, anasarca, dizziness, palpitations GI  No heartburn, indigestion, abdominal pain, nausea, vomiting, diarrhea, change in bowel habits, loss of appetite Resp:.   +excess mucus ,productive cough, + non-productive cough,  No coughing up of blood.  No change in color of mucus.  No wheezing.  Skin: no rash or lesions. GU: . MS:  No joint pain or swelling.   Psych:  No change in mood or affect. No depression, some anxiety.  No memory loss.  Objective:   Physical Exam General- Alert, Oriented, Affect-appropriate, Distress- none acute, using a walker.   Elderly. Wheelchair, room air. Skin- rash-none, lesions- none, excoriation- none Lymphadenopathy- none Head- atraumatic            Eyes- Gross vision intact, PERRLA, conjunctivae clear secretions            Ears- Hearing, canals-normal            Nose- Clear, no-Septal dev, mucus, polyps, erosion, perforation             Throat- Mallampati II , mucosa clear , drainage- none, tonsils- atrophic. +throat clearing Neck- flexible , trachea midline, no stridor , thyroid nl, carotid no bruit Chest - symmetrical excursion , unlabored           Heart/CV- RRR , no murmur , no gallop  , no rub, nl s1 s2                           - JVD- none , edema- 1-2+, stasis changes- none, varices- none           Lung- +bilateral mild rhonchi/crackles - lower third of back, wheeze- none,                            +  dry  cough- with deep breath , dullness-none, rub- none           Chest wall-  Abd-  Br/ Gen/ Rectal- Not done, not indicated Extrem- cyanosis- none, clubbing, none, atrophy- none, strength- nl, using a walker Neuro- speech a little hesitant, not slurred

## 2012-11-07 NOTE — Patient Instructions (Addendum)
Sample and script Breo Ellipta    1 puff, then rinse mouth, just once every day   Ok to use the nebulizer machine up to 4 times daily as needed

## 2012-11-13 ENCOUNTER — Telehealth: Payer: Self-pay | Admitting: Internal Medicine

## 2012-11-13 MED ORDER — BENZONATATE 100 MG PO CAPS: 100.0000 mg | ORAL_CAPSULE | Freq: Four times a day (QID) | ORAL | Status: DC | PRN

## 2012-11-13 NOTE — Telephone Encounter (Signed)
Refill on tessalon sent as filled before to [facility] pt x1, line rang multiple times with no answer then began doing short bursts of beeping followed by a longer beep > left a message TCB but unsure if this was pt's answering machine as there was no message.

## 2012-11-13 NOTE — Telephone Encounter (Signed)
Called spoke with patient who is requesting a refill on her Tessalon Last ov w/ CY was 6.5.14 Dr [name] please advise if okay to fill this medication, thank you.

## 2012-11-13 NOTE — Telephone Encounter (Signed)
Ok refill? 

## 2012-11-14 NOTE — Telephone Encounter (Signed)
Spoke with the pt and notified that her rx was sent to pharm  She verbalized understanding and nothing further needed

## 2012-11-21 ENCOUNTER — Emergency Department (HOSPITAL_BASED_OUTPATIENT_CLINIC_OR_DEPARTMENT_OTHER)
Admission: EM | Admit: 2012-11-21 | Discharge: 2012-11-21 | Disposition: A | Discharge status: Home / Self Care | Payer: Medicare Other | Attending: Emergency Medicine | Admitting: Emergency Medicine

## 2012-11-21 ENCOUNTER — Encounter (HOSPITAL_BASED_OUTPATIENT_CLINIC_OR_DEPARTMENT_OTHER): Payer: Self-pay

## 2012-11-21 ENCOUNTER — Emergency Department (HOSPITAL_BASED_OUTPATIENT_CLINIC_OR_DEPARTMENT_OTHER): Payer: Medicare Other

## 2012-11-21 DIAGNOSIS — Z79899 Other long term (current) drug therapy: Secondary | ICD-10-CM | POA: Insufficient documentation

## 2012-11-21 DIAGNOSIS — I2510 Atherosclerotic heart disease of native coronary artery without angina pectoris: Secondary | ICD-10-CM | POA: Insufficient documentation

## 2012-11-21 DIAGNOSIS — R0789 Other chest pain: Secondary | ICD-10-CM | POA: Insufficient documentation

## 2012-11-21 DIAGNOSIS — G4700 Insomnia, unspecified: Secondary | ICD-10-CM | POA: Insufficient documentation

## 2012-11-21 DIAGNOSIS — K5732 Diverticulitis of large intestine without perforation or abscess without bleeding: Secondary | ICD-10-CM | POA: Insufficient documentation

## 2012-11-21 DIAGNOSIS — Z8719 Personal history of other diseases of the digestive system: Secondary | ICD-10-CM | POA: Insufficient documentation

## 2012-11-21 DIAGNOSIS — IMO0002 Reserved for concepts with insufficient information to code with codable children: Secondary | ICD-10-CM | POA: Insufficient documentation

## 2012-11-21 DIAGNOSIS — J45909 Unspecified asthma, uncomplicated: Secondary | ICD-10-CM | POA: Insufficient documentation

## 2012-11-21 DIAGNOSIS — Z9889 Other specified postprocedural states: Secondary | ICD-10-CM | POA: Insufficient documentation

## 2012-11-21 DIAGNOSIS — F411 Generalized anxiety disorder: Secondary | ICD-10-CM | POA: Insufficient documentation

## 2012-11-21 DIAGNOSIS — Z862 Personal history of diseases of the blood and blood-forming organs and certain disorders involving the immune mechanism: Secondary | ICD-10-CM | POA: Insufficient documentation

## 2012-11-21 DIAGNOSIS — K5792 Diverticulitis of intestine, part unspecified, without perforation or abscess without bleeding: Secondary | ICD-10-CM

## 2012-11-21 DIAGNOSIS — Z8669 Personal history of other diseases of the nervous system and sense organs: Secondary | ICD-10-CM | POA: Insufficient documentation

## 2012-11-21 DIAGNOSIS — Z8679 Personal history of other diseases of the circulatory system: Secondary | ICD-10-CM | POA: Insufficient documentation

## 2012-11-21 DIAGNOSIS — I252 Old myocardial infarction: Secondary | ICD-10-CM | POA: Insufficient documentation

## 2012-11-21 LAB — URINALYSIS, ROUTINE W REFLEX MICROSCOPIC
Bilirubin Urine: NEGATIVE
Glucose, UA: NEGATIVE mg/dL
Ketones, ur: 15 mg/dL — AB
Nitrite: NEGATIVE
Protein, ur: NEGATIVE mg/dL
Specific Gravity, Urine: 1.018 (ref 1.005–1.030)
Urobilinogen, UA: 0.2 mg/dL (ref 0.0–1.0)
pH: 6.5 (ref 5.0–8.0)

## 2012-11-21 LAB — CBC WITH DIFFERENTIAL/PLATELET
Basophils Absolute: 0 K/uL (ref 0.0–0.1)
Basophils Relative: 0 % (ref 0–1)
Eosinophils Absolute: 0 K/uL (ref 0.0–0.7)
Eosinophils Relative: 0 % (ref 0–5)
HCT: 39.7 % (ref 36.0–46.0)
Hemoglobin: 13.9 g/dL (ref 12.0–15.0)
Lymphocytes Relative: 14 % (ref 12–46)
Lymphs Abs: 1.3 10*3/uL (ref 0.7–4.0)
MCH: 29.8 pg (ref 26.0–34.0)
MCHC: 35 g/dL (ref 30.0–36.0)
MCV: 85.2 fL (ref 78.0–100.0)
Monocytes Absolute: 0.4 10*3/uL (ref 0.1–1.0)
Monocytes Relative: 4 % (ref 3–12)
Neutro Abs: 7.9 10*3/uL — ABNORMAL HIGH (ref 1.7–7.7)
Neutrophils Relative %: 82 % — ABNORMAL HIGH (ref 43–77)
Platelets: 98 K/uL — ABNORMAL LOW (ref 150–400)
RBC: 4.66 MIL/uL (ref 3.87–5.11)
RDW: 13.8 % (ref 11.5–15.5)
Smear Review: ADEQUATE
WBC: 9.6 K/uL (ref 4.0–10.5)

## 2012-11-21 LAB — URINE MICROSCOPIC-ADD ON

## 2012-11-21 LAB — LIPASE, BLOOD: Lipase: 69 U/L — ABNORMAL HIGH (ref 11–59)

## 2012-11-21 LAB — COMPREHENSIVE METABOLIC PANEL WITH GFR
ALT: 17 U/L (ref 0–35)
Albumin: 3.8 g/dL (ref 3.5–5.2)
Alkaline Phosphatase: 84 U/L (ref 39–117)
BUN: 19 mg/dL (ref 6–23)
Calcium: 9.7 mg/dL (ref 8.4–10.5)
Potassium: 4.5 meq/L (ref 3.5–5.1)
Sodium: 135 meq/L (ref 135–145)
Total Protein: 7.3 g/dL (ref 6.0–8.3)

## 2012-11-21 LAB — COMPREHENSIVE METABOLIC PANEL
AST: 23 U/L (ref 0–37)
CO2: 24 mEq/L (ref 19–32)
Chloride: 98 mEq/L (ref 96–112)
Creatinine, Ser: 1 mg/dL (ref 0.50–1.10)
GFR calc Af Amer: 55 mL/min — ABNORMAL LOW (ref >90)
GFR calc non Af Amer: 48 mL/min — ABNORMAL LOW (ref >90)
Glucose, Bld: 112 mg/dL — ABNORMAL HIGH (ref 70–99)
Total Bilirubin: 0.9 mg/dL (ref 0.3–1.2)

## 2012-11-21 LAB — CG4 I-STAT (LACTIC ACID): Lactic Acid, Venous: 1.1 mmol/L (ref 0.5–2.2)

## 2012-11-21 MED ORDER — CIPROFLOXACIN HCL 500 MG PO TABS: 500.0000 mg | ORAL_TABLET | Freq: Two times a day (BID) | ORAL | Status: DC

## 2012-11-21 MED ORDER — METRONIDAZOLE 500 MG PO TABS: 500.0000 mg | ORAL_TABLET | Freq: Two times a day (BID) | ORAL | Status: DC

## 2012-11-21 MED ORDER — METRONIDAZOLE 500 MG PO TABS
500.0000 mg | ORAL_TABLET | Freq: Once | ORAL | Status: AC
  Administered 2012-11-21: 500 mg via ORAL
  Filled 2012-11-21: qty 1

## 2012-11-21 MED ORDER — FENTANYL CITRATE 0.05 MG/ML IJ SOLN
50.0000 ug | Freq: Once | INTRAMUSCULAR | Status: DC
  Filled 2012-11-21: qty 2

## 2012-11-21 MED ORDER — IOHEXOL 300 MG/ML  SOLN
100.0000 mL | Freq: Once | INTRAMUSCULAR | Status: AC | PRN
  Administered 2012-11-21: 100 mL via INTRAVENOUS

## 2012-11-21 MED ORDER — CIPROFLOXACIN HCL 500 MG PO TABS
500.0000 mg | ORAL_TABLET | Freq: Once | ORAL | Status: AC
  Administered 2012-11-21: 500 mg via ORAL
  Filled 2012-11-21: qty 1

## 2012-11-21 MED ORDER — IOHEXOL 300 MG/ML  SOLN
50.0000 mL | Freq: Once | INTRAMUSCULAR | Status: AC | PRN
  Administered 2012-11-21: 50 mL via ORAL

## 2012-11-21 NOTE — ED Notes (Signed)
Pt reports that this AM she started having chest pain.

## 2012-11-21 NOTE — Assessment & Plan Note (Signed)
Chest is never clear any longer but sputum is white. We will manage this as an exacerbation of COPD by trying LABA/ICS Plan-Breo Ellipta with education

## 2012-11-21 NOTE — ED Notes (Signed)
Abdominal pain that started yesterday.

## 2012-11-21 NOTE — ED Provider Notes (Signed)
CT findings consistent with diverticulitis. Patient displays toxic signs and family is present. She will be started on Cipro and Flagyl and given strict return precautions.  Gwyneth Sprout, MD 11/21/12 1601

## 2012-11-21 NOTE — ED Provider Notes (Signed)
History     CSN: [id number]  Arrival date & time 11/21/12  1247   First MD Initiated Contact with Patient 11/21/12 1317      Chief Complaint  Patient presents with  . Abdominal Pain  . Chest Pain    (Consider location/radiation/quality/duration/timing/severity/associated sxs/prior treatment) HPI Comments: PT reports pain began shortly after eating a sweet potato for dinner.  NO N/V/D, had a normal BM this AM with no change in pain.  Pt has had a colonoscopy years ago, told normal.  Pt has had cholecystectomy in the past.  Pt had no initial complaint of CP at arrival, however when RN asked her if she had any CP or pressure, she complained that she did.  However by the time that I asked her about it, she reports it was resolved completely.    Patient is a 79 y.o. female presenting with abdominal pain and chest pain. The history is provided by the patient and a relative.  Abdominal Pain This is a new problem. The current episode started yesterday. The problem occurs constantly. The problem has not changed since onset.Associated symptoms include chest pain and abdominal pain. Pertinent negatives include no headaches and no shortness of breath. Associated symptoms comments: anorexia. Nothing aggravates the symptoms. Nothing relieves the symptoms. She has tried nothing for the symptoms.  Chest Pain Pain location:  Unable to specify Pain quality: pressure   Pain radiates to:  Does not radiate Pain radiates to the back: no   Pain severity:  No pain Onset quality:  Unable to specify Duration:  15 minutes Progression:  Resolved Chronicity:  New Context: not breathing, not eating and no trauma   Relieved by:  None tried Associated symptoms: abdominal pain   Associated symptoms: no back pain, no cough, no fever, no headache, no nausea, no shortness of breath and not vomiting     Past Medical History  Diagnosis Date  . Insomnia, unspecified   . Leukocytopenia, unspecified   . Anemia,  unspecified   . Other left bundle branch block   . Coronary atherosclerosis of unspecified type of vessel, native or graft   . Heart attack   . Esophageal reflux   . Unspecified asthma(493.90)   . Paralysis agitans   . Anxiety state, unspecified   . Positive PPD     Past Surgical History  Procedure Laterality Date  . Orif hip fracture  05-2008    partial hip replacement  . Cholecystectomy    . Tonsillectomy      Family History  Problem Relation Age of Onset  . Cancer Mother     History  Substance Use Topics  . Smoking status: Never Smoker   . Smokeless tobacco: Not on file  . Alcohol Use: No    OB History   Grav Para Term Preterm Abortions TAB SAB Ect Mult Living                  Review of Systems  Constitutional: Positive for appetite change. Negative for fever and chills.  Respiratory: Negative for cough and shortness of breath.   Cardiovascular: Positive for chest pain.  Gastrointestinal: Positive for abdominal pain. Negative for nausea, vomiting, diarrhea, constipation and blood in stool.  Musculoskeletal: Negative for back pain.  Skin: Negative for color change and rash.  Neurological: Negative for headaches.  All other systems reviewed and are negative.    Allergies  Codeine; Hydrocod polst-cpm polst er; Hydrocodone; Hydromorphone hcl; and Morphine  Home Medications   Current  Outpatient Rx  Name  Route  Sig  Dispense  Refill  . acetaminophen (TYLENOL) 500 MG tablet   Oral   Take 500 mg by mouth every 6 (six) hours as needed.         Marland Kitchen albuterol (PROAIR HFA) 108 (90 BASE) MCG/ACT inhaler   Inhalation   Inhale 2 puffs into the lungs 4 (four) times daily.           Marland Kitchen albuterol (PROVENTIL) (2.5 MG/3ML) 0.083% nebulizer solution   Nebulization   Take 3 mLs (2.5 mg total) by nebulization every 6 (six) hours as needed for wheezing or shortness of breath. DX:  493.90   360 mL   3   . benzonatate (TESSALON) 100 MG capsule   Oral   Take 1  capsule (100 mg total) by mouth every 6 (six) hours as needed for cough.   30 capsule   1   . diphenhydrAMINE (BENADRYL CHILDRENS ALLERGY) 12.5 MG/5ML liquid   Oral   Take 12.5 mg by mouth 4 (four) times daily as needed for allergies.         . Fluticasone Furoate-Vilanterol (BREO ELLIPTA) 100-25 MCG/INH AEPB   Inhalation   Inhale 1 puff into the lungs daily.   1 each   prn   . Fluticasone Furoate-Vilanterol (BREO ELLIPTA) 100-25 MCG/INH AEPB   Inhalation   Inhale 1 puff into the lungs daily.   1 each   0     BP 93/65  Pulse 106  Temp(Src) 98.1 F (36.7 C) (Oral)  Resp 16  Wt 136 lb (61.689 kg)  BMI 24.87 kg/m2  SpO2 96%  Physical Exam  Nursing note and vitals reviewed. Constitutional: She is oriented to person, place, and time. She appears well-developed and well-nourished.  HENT:  Head: Normocephalic and atraumatic.  Eyes: Conjunctivae and EOM are normal. No scleral icterus.  Neck: Neck supple.  Cardiovascular: Normal rate, regular rhythm and intact distal pulses.   Pulmonary/Chest: Effort normal. No respiratory distress.  Abdominal: Soft. She exhibits no distension. There is tenderness. There is no rebound and no guarding.  Musculoskeletal: She exhibits no tenderness.  Neurological: She is alert and oriented to person, place, and time. She exhibits normal muscle tone. Coordination normal.  Skin: Skin is warm and dry. No rash noted. She is not diaphoretic.  Psychiatric: She has a normal mood and affect.    ED Course  Procedures (including critical care time)  Labs Reviewed  URINALYSIS, ROUTINE W REFLEX MICROSCOPIC - Abnormal; Notable for the following:    Color, Urine AMBER (*)    Hgb urine dipstick SMALL (*)    Ketones, ur 15 (*)    Leukocytes, UA TRACE (*)    All other components within normal limits  CBC WITH DIFFERENTIAL - Abnormal; Notable for the following:    Platelets 98 (*)    Neutrophils Relative % 82 (*)    Neutro Abs 7.9 (*)    All other  components within normal limits  LIPASE, BLOOD - Abnormal; Notable for the following:    Lipase 69 (*)    All other components within normal limits  COMPREHENSIVE METABOLIC PANEL - Abnormal; Notable for the following:    Glucose, Bld 112 (*)    GFR calc non Af Amer 48 (*)    GFR calc Af Amer 55 (*)    All other components within normal limits  URINE MICROSCOPIC-ADD ON - Abnormal; Notable for the following:    Bacteria, UA FEW (*)  All other components within normal limits  LACTIC ACID, PLASMA  CG4 I-STAT (LACTIC ACID)   No results found.   Impression: Abdominal pain  ra sat is 96% and I interpret to be adequate  ECG at time 13:50 shows sinus tachycardia at rate 103, LBBB, LAD, no sig change from ECG on 11/16/10.   MDM    Pt with brief episdoes of chest pressure, no supporting signs suggesting ACS.  ECG shows no overt new iscehmic changes.  Pt with tender abd, LLQ primarily, will get CT of abd/pelvis to assess for diverticulitis.  No vomiting, pt can likely be discharged. Will sign out to Dr. Anitra Lauth.  Lactic acid is normal.          Gavin Pound. Farris Geiman, MD 11/21/12 [phone]

## 2012-11-27 ENCOUNTER — Telehealth: Payer: Self-pay | Admitting: Internal Medicine

## 2012-11-27 NOTE — Telephone Encounter (Signed)
Stop the [facility] and see how she feels not using anything for awhile. Ok to use her nebulizer machine if needed.  If breathing is uncomfortable let us know.

## 2012-11-27 NOTE — Telephone Encounter (Signed)
lmomtcb for pt 

## 2012-11-27 NOTE — Telephone Encounter (Signed)
Spoke with the pt She states was started on Breo inhaler 11/07/12 Does not see any improvement in her breathing  She has also noticed non prod cough after each dose is used Please advise thanks! Last ov 11/07/12  Next ov 02/06/13 Allergies  Allergen Reactions  . Codeine   . Hydrocod Polst-Cpm Polst Er   . Hydrocodone   . Hydromorphone Hcl     REACTION: itching  . Morphine     REACTION: itching

## 2012-11-28 NOTE — Telephone Encounter (Signed)
Spoke with patient, made her aware of recs per Dr. [name] Patient verbalzied understanding and nothing further needed at this time

## 2012-12-31 ENCOUNTER — Telehealth: Payer: Self-pay | Admitting: Internal Medicine

## 2012-12-31 MED ORDER — ALBUTEROL SULFATE (2.5 MG/3ML) 0.083% IN NEBU: 2.5000 mg | INHALATION_SOLUTION | Freq: Four times a day (QID) | RESPIRATORY_TRACT | Status: DC | PRN

## 2012-12-31 NOTE — Telephone Encounter (Signed)
Mailed to patient as requested.

## 2012-12-31 NOTE — Telephone Encounter (Signed)
Spoke with patients daughter--  She states the Rx costs for patients albuterol neb has now increased and Medicare does not look at this as a drug Patients daughter does not want to use [facility] and would prefer to "shop around" Requesting rx be printed out and mailed to her home @  [address]  [place], [place] [address]   Rx has been placed on Dr. Sinclair Ship cart with enevlope for mailing Please advise

## 2013-01-23 ENCOUNTER — Ambulatory Visit (INDEPENDENT_AMBULATORY_CARE_PROVIDER_SITE_OTHER): Payer: Medicare Other | Admitting: Internal Medicine

## 2013-01-23 ENCOUNTER — Telehealth: Payer: Self-pay | Admitting: Internal Medicine

## 2013-01-23 ENCOUNTER — Encounter: Payer: Self-pay | Admitting: Internal Medicine

## 2013-01-23 VITALS — BP 108/58 | HR 67 | Temp 97.7°F | Ht 62.0 in | Wt 139.2 lb

## 2013-01-23 DIAGNOSIS — J479 Bronchiectasis, uncomplicated: Secondary | ICD-10-CM

## 2013-01-23 MED ORDER — MOMETASONE FURO-FORMOTEROL FUM 100-5 MCG/ACT IN AERO: INHALATION_SPRAY | RESPIRATORY_TRACT | Status: DC

## 2013-01-23 MED ORDER — PREDNISONE (PAK) 10 MG PO TABS: ORAL_TABLET | ORAL | Status: DC

## 2013-01-23 NOTE — Progress Notes (Signed)
Patient ID: [name], female    DOB: 1919/01/02, 79 y.o.   MRN: [id number]    Brief patient profile:   89yoF never smoker, here with daughter(pharmacist) , followed for asthma and dyspnea with complicating co-morbidities reviewed.    08/12/12-  , here with daughter(pharmacist) , followed for asthma and dyspnea with complicating co-morbidities reviewed.    ACUTE VISIT: increased cough-productive-gray in color since Wednesday, SOB and wheezing as well. Denies any chills.  Using nebulizer about TID now. Increased cough for one week as described. Denies fever, swollen glands purulent sputum, chest pain or symptoms suggesting non-respiratory infection. CXR 03/08/12- IMPRESSION:  Chronic changes as above. No acute findings.     11/07/12-   here with daughter(pharmacist) , followed for asthma and dyspnea with complicating co-morbidities reviewed.  ACUTE VISIT: SOB getting worse since last visit-worse with activity; unsure if happening when lying down. Gradually worsening dyspnea with exertion. Cough productive scant white sputum. No wheeze. Using nebulizer more. CXR 08/13/12 IMPRESSION:  Stable COPD and chronic changes. No acute cardiopulmonary disease.  Sample and script Breo Ellipta    1 puff, then rinse mouth, just once every day Ok to use the nebulizer machine up to 4 times daily as needed   01/23/2013 acute  ov/[name] re asthma/ worse cough so stopped breo Chief Complaint  Patient presents with  . acute office visit    Pt c/o increased SOB, cough and wheezing x 2-3 weeks.   cough is mostly dry. Sometimes sob at rest, worse at hs. Better p neb using avg 4x daily   No obvious daytime variabilty or assoc  cp or chest tightness overt sinus or hb symptoms. No unusual exp hx or h/o childhood pna/ asthma or knowledge of premature birth.   . Also denies any obvious fluctuation of symptoms with weather or environmental changes or other aggravating or alleviating factors except as outlined  above   Current Medications, Allergies, Past Medical History, Past Surgical History, Family History, and Social History were reviewed in [employer] record.  ROS  The following are not active complaints unless bolded sore throat, dysphagia, dental problems, itching, sneezing,  nasal congestion or excess/ purulent secretions, ear ache,   fever, chills, sweats, unintended wt loss, pleuritic or exertional cp, hemoptysis,  orthopnea pnd or leg swelling, presyncope, palpitations, heartburn, abdominal pain, anorexia, nausea, vomiting, diarrhea  or change in bowel or urinary habits, change in stools or urine, dysuria,hematuria,  rash, arthralgias, visual complaints, headache, numbness weakness or ataxia or problems with walking or coordination,  change in mood/affect or memory.          Objective:   Physical Exam    amb wf nad  Wt Readings from Last 3 Encounters:  01/23/13 139 lb 3.2 oz (63.141 kg)  11/21/12 136 lb (61.689 kg)  11/07/12 136 lb 12.8 oz (62.052 kg)    Frail elderly wf nad  HEENT mild turbinate edema.  Oropharynx no thrush or excess pnd or cobblestoning.  No JVD or cervical adenopathy. Mild accessory muscle hypertrophy. Trachea midline, nl thryroid. Chest was hyperinflated by percussion with diminished breath sounds and moderate increased exp time with bilateral exp  wheeze. Hoover sign positive at mid inspiration. Regular rate and rhythm without murmur gallop or rub or increase P2 or edema.  Abd: no hsm, nl excursion. Ext warm without cyanosis or clubbing.       cxr 08/12/12 Stable COPD and chronic changes. No acute cardiopulmonary disease.

## 2013-01-23 NOTE — Telephone Encounter (Signed)
I spoke with spoke daughter. She wanted to scheduled pt appt d/t being more SOB than her usual. She is scheduled to come in and see MW at 4:30 today. Nothing further needed

## 2013-01-23 NOTE — Patient Instructions (Addendum)
Try dulera 100 Take 2 puffs first thing in am and then another 2 puffs about 12 hours later.   Work on inhaler technique:  relax and gently blow all the way out then take a nice smooth deep breath back in, triggering the inhaler at same time you start breathing in.  Hold for up to 5 seconds if you can.  Rinse and gargle with water when done   If not better Dr [name] may want to consider a nebulizer with the similar contents as dulera     Try prilosec 20mg   Take 30-60 min before first meal of the day and Zantac 150  one bedtime until cough is completely gone for at least a week without the need for cough suppression  I think of reflux for chronic cough like I do oxygen for fire (doesn't cause the fire but once you get the oxygen suppressed it usually goes away regardless of the exact cause).   GERD (REFLUX)  is an extremely common cause of respiratory symptoms, many times with no significant heartburn at all.    It can be treated with medication, but also with lifestyle changes including avoidance of late meals, excessive alcohol, smoking cessation, and avoid fatty foods, chocolate, peppermint, colas, red wine, and acidic juices such as orange juice.  NO MINT OR MENTHOL PRODUCTS SO NO COUGH DROPS  USE SUGARLESS CANDY INSTEAD (jolley ranchers or Stover's)  NO OIL BASED VITAMINS - use powdered substitutes.

## 2013-01-24 NOTE — Assessment & Plan Note (Addendum)
CT 11/16/10 1. Chronic bronchitic changes.  2. Bronchiectasis at the bases.  3. Cardiomegaly without pulmonary edema  DDX of  difficult airways managment all start with A and  include Adherence, Ace Inhibitors, Acid Reflux, Active Sinus Disease, Alpha 1 Antitripsin deficiency, Anxiety masquerading as Airways dz,  ABPA,  allergy(esp in young), Aspiration (esp in elderly), Adverse effects of DPI,  Active smokers, plus two Bs  = Bronchiectasis and Beta blocker use..and one C= CHF   Could not tol breo due to cough but def responds to saba ? Getting too dep on it.    Options are laba/ics neb vs hfa  rec trial of dulera 100 2bid (the least likely to cause cough ) x 2 weeks then regroup  The proper method of use, as well as anticipated side effects, of a metered-dose inhaler are discussed and demonstrated to the patient. Improved effectiveness after extensive coaching during this visit to a level of approximately  75%    ? Could gerd be contributing > trial of ppi in am and h2 at hs rec

## 2013-02-06 ENCOUNTER — Ambulatory Visit (INDEPENDENT_AMBULATORY_CARE_PROVIDER_SITE_OTHER): Payer: Medicare Other | Admitting: Internal Medicine

## 2013-02-06 ENCOUNTER — Encounter: Payer: Self-pay | Admitting: Internal Medicine

## 2013-02-06 VITALS — BP 122/78 | HR 64 | Ht 62.0 in | Wt 143.0 lb

## 2013-02-06 DIAGNOSIS — J441 Chronic obstructive pulmonary disease with (acute) exacerbation: Secondary | ICD-10-CM

## 2013-02-06 DIAGNOSIS — K219 Gastro-esophageal reflux disease without esophagitis: Secondary | ICD-10-CM

## 2013-02-06 NOTE — Progress Notes (Signed)
Patient ID: [name], female    DOB: 31-Mar-1919, 79 y.o.   MRN: [id number]    Brief patient profile:   89yoF never smoker, here with daughter(pharmacist) , followed for asthma and dyspnea with complicating co-morbidities reviewed.    08/12/12-  , here with daughter(pharmacist) , followed for asthma and dyspnea with complicating co-morbidities reviewed.    ACUTE VISIT: increased cough-productive-gray in color since Wednesday, SOB and wheezing as well. Denies any chills.  Using nebulizer about TID now. Increased cough for one week as described. Denies fever, swollen glands purulent sputum, chest pain or symptoms suggesting non-respiratory infection. CXR 03/08/12- IMPRESSION:  Chronic changes as above. No acute findings.     11/07/12-   here with daughter(pharmacist) , followed for asthma and dyspnea with complicating co-morbidities reviewed.  ACUTE VISIT: SOB getting worse since last visit-worse with activity; unsure if happening when lying down. Gradually worsening dyspnea with exertion. Cough productive scant white sputum. No wheeze. Using nebulizer more. CXR 08/13/12 IMPRESSION:  Stable COPD and chronic changes. No acute cardiopulmonary disease.  Sample and script Breo Ellipta    1 puff, then rinse mouth, just once every day Ok to use the nebulizer machine up to 4 times daily as needed   01/23/2013 acute  ov/Wert re asthma/ worse cough so stopped breo Chief Complaint  Patient presents with  . acute office visit    Pt c/o increased SOB, cough and wheezing x 2-3 weeks.   cough is mostly dry. Sometimes sob at rest, worse at hs. Better p neb using avg 4x daily  No obvious daytime variabilty or assoc  cp or chest tightness overt sinus or hb symptoms. No unusual exp hx or h/o childhood pna/ asthma or knowledge of premature birth.  Also denies any obvious fluctuation of symptoms with weather or environmental changes or other aggravating or alleviating factors except as outlined above   Current Medications, Allergies, Past Medical History, Past Surgical History, Family History, and Social History were reviewed in [employer] record.  02/06/13- 89yoF never smoker, here with daughter(pharmacist) , followed for asthma and dyspnea with complicating co-morbidities reviewed.   FOLLOWS FOR: seen last by MW on 01-23-13; Feels like Elwin Sleight is not effective.  Daughter here Saw no benefit from [place]. Frontal headache persists, treated with Tylenol at bedtime. Denies nasal or sinus discomfort. Minor cough. No wheeze. Steroids flared acne. Daughter thought maybe acid blocker helped the cough some. We talked again about reflux CXR 08/15/12 IMPRESSION:  Stable COPD and chronic changes. No acute cardiopulmonary disease.  Original Report Authenticated By: Charlett Nose, M.D.   Objective:   OBJ- Physical Exam General- Alert, Oriented, Affect-appropriate, Distress- none acute Skin- rash-none, lesions- none, excoriation- none Lymphadenopathy- none Head- atraumatic            Eyes- Gross vision intact, PERRLA, conjunctivae and secretions clear            Ears- Hearing, canals-normal            Nose- +mucus bridging, no-Septal dev, polyps, erosion, perforation             Throat- Mallampati II , mucosa clear , drainage- none, tonsils- atrophic Neck- flexible , trachea midline, no stridor , thyroid nl, carotid no bruit Chest - symmetrical excursion , unlabored           Heart/CV- RRR , no murmur , no gallop  , no rub, nl s1 s2                           -  JVD- none , edema- none, stasis changes- none, varices- none           Lung- +bibasilar crackles, wheeze- none, cough- none , dullness-none, rub- none           Chest wall-  Abd-  Br/ Gen/ Rectal- Not done, not indicated Extrem- cyanosis- none, clubbing, none, atrophy- none, strength- nl Neuro- grossly intact to observation

## 2013-02-06 NOTE — Patient Instructions (Addendum)
The acid blocker meds may have helped your cough some. You can watch for awhile off of them, but consider giving it another trial if the cough gets bad.

## 2013-02-15 NOTE — Assessment & Plan Note (Signed)
Reemphasized reflux precautions and swallowing precautions while paying attention to effect on cough

## 2013-02-15 NOTE — Assessment & Plan Note (Signed)
She declines flu vaccine-discussed

## 2013-03-19 ENCOUNTER — Encounter: Payer: Self-pay | Admitting: Internal Medicine

## 2013-03-19 ENCOUNTER — Ambulatory Visit (INDEPENDENT_AMBULATORY_CARE_PROVIDER_SITE_OTHER): Payer: Medicare Other | Admitting: Internal Medicine

## 2013-03-19 VITALS — BP 120/64 | HR 64 | Ht 62.0 in | Wt 139.0 lb

## 2013-03-19 DIAGNOSIS — J209 Acute bronchitis, unspecified: Secondary | ICD-10-CM

## 2013-03-19 DIAGNOSIS — G20 Parkinson's disease: Secondary | ICD-10-CM

## 2013-03-19 DIAGNOSIS — J069 Acute upper respiratory infection, unspecified: Secondary | ICD-10-CM

## 2013-03-19 MED ORDER — METHYLPREDNISOLONE ACETATE 80 MG/ML IJ SUSP
80.0000 mg | Freq: Once | INTRAMUSCULAR | Status: AC
  Administered 2013-03-19: 80 mg via INTRAMUSCULAR

## 2013-03-19 MED ORDER — PHENYLEPHRINE HCL 1 % NA SOLN
3.0000 [drp] | Freq: Once | NASAL | Status: AC
  Administered 2013-03-19: 3 [drp] via NASAL

## 2013-03-19 NOTE — Assessment & Plan Note (Signed)
Nasal congestion- for neb nasal decongestant Tracheobronchitis- favor viral over allergy. - Plan depo 80, continue Mucinex-DM

## 2013-03-19 NOTE — Progress Notes (Signed)
Patient ID: [name], female    DOB: January 31, 1919, 79 y.o.   MRN: [id number]    Brief patient profile:   89yoF never smoker, here with daughter(pharmacist) , followed for asthma and dyspnea with complicating co-morbidities reviewed.    08/12/12-  , here with daughter(pharmacist) , followed for asthma and dyspnea with complicating co-morbidities reviewed.    ACUTE VISIT: increased cough-productive-gray in color since Wednesday, SOB and wheezing as well. Denies any chills.  Using nebulizer about TID now. Increased cough for one week as described. Denies fever, swollen glands purulent sputum, chest pain or symptoms suggesting non-respiratory infection. CXR 03/08/12- IMPRESSION:  Chronic changes as above. No acute findings.     11/07/12-   here with daughter(pharmacist) , followed for asthma and dyspnea with complicating co-morbidities reviewed.  ACUTE VISIT: SOB getting worse since last visit-worse with activity; unsure if happening when lying down. Gradually worsening dyspnea with exertion. Cough productive scant white sputum. No wheeze. Using nebulizer more. CXR 08/13/12 IMPRESSION:  Stable COPD and chronic changes. No acute cardiopulmonary disease.  Sample and script Breo Ellipta    1 puff, then rinse mouth, just once every day Ok to use the nebulizer machine up to 4 times daily as needed   01/23/2013 acute  ov/Wert re asthma/ worse cough so stopped breo Chief Complaint  Patient presents with  . acute office visit    Pt c/o increased SOB, cough and wheezing x 2-3 weeks.   cough is mostly dry. Sometimes sob at rest, worse at hs. Better p neb using avg 4x daily  No obvious daytime variabilty or assoc  cp or chest tightness overt sinus or hb symptoms. No unusual exp hx or h/o childhood pna/ asthma or knowledge of premature birth.  Also denies any obvious fluctuation of symptoms with weather or environmental changes or other aggravating or alleviating factors except as outlined above   Current Medications, Allergies, Past Medical History, Past Surgical History, Family History, and Social History were reviewed in [employer] record.  02/06/13- 89yoF never smoker, here with daughter(pharmacist) , followed for asthma and dyspnea with complicating co-morbidities reviewed.   FOLLOWS FOR: seen last by MW on 01-23-13; Feels like Elwin Sleight is not effective.  Daughter here Saw no benefit from [place]. Frontal headache persists, treated with Tylenol at bedtime. Denies nasal or sinus discomfort. Minor cough. No wheeze. Steroids flared acne. Daughter thought maybe acid blocker helped the cough some. We talked again about reflux CXR 08/15/12 IMPRESSION:  Stable COPD and chronic changes. No acute cardiopulmonary disease.  Original Report Authenticated By: Charlett Nose, M.D.   03/19/13- 89yoF never smoker, here with daughter(pharmacist) , followed for asthma and dyspnea with complicating co-morbidities reviewed.  Refuses flu shot- discussed FOLLOWS FOR:  Increased SOB, tightness in chest and cough w/ clear mucus x2 wks Caught cold. Frontal sinus pressure, nasal congestion-clear mucus, persistent cough- clear mucus. No fever, sore throat, ears or purulent. Mucinex-DM some help.  Objective:   OBJ- Physical Exam General- Alert, Oriented, Affect-appropriate, Distress- none acute Skin- rash-none, lesions- none, excoriation- none Lymphadenopathy- none Head- atraumatic            Eyes- Gross vision intact, PERRLA, conjunctivae and secretions clear            Ears- Hearing, canals-normal            Nose- +mucus bridging, no-Septal dev, polyps, erosion, perforation             Throat- Mallampati  II , mucosa clear-not red , drainage- none, tonsils- atrophic Neck- flexible , trachea midline, no stridor , thyroid nl, carotid no bruit Chest - symmetrical excursion , unlabored           Heart/CV- RRR , no murmur , no gallop  , no rub, nl s1 s2                           -  JVD- none , edema- none, stasis changes- none, varices- none           Lung- +bibasilar crackles, wheeze- none, cough+raspy , dullness-none, rub- none           Chest wall-  Abd-  Br/ Gen/ Rectal- Not done, not indicated Extrem- cyanosis- none, clubbing, none, atrophy- none, strength- nl Neuro- grossly intact to observation

## 2013-03-19 NOTE — Patient Instructions (Signed)
Neb neo nasal  Depo 80  Mucinex-DM and tylenol ok as needed

## 2013-05-21 ENCOUNTER — Telehealth: Payer: Self-pay | Admitting: Internal Medicine

## 2013-05-21 NOTE — Telephone Encounter (Signed)
ATC PT NA WCB 

## 2013-05-22 NOTE — Telephone Encounter (Signed)
Spoke with the pt and she states that nothing is needed. Carron Curie, CMA

## 2013-06-23 ENCOUNTER — Telehealth: Payer: Self-pay | Admitting: Internal Medicine

## 2013-06-23 NOTE — Telephone Encounter (Signed)
Pt has been scheduled with TP on 06/24/13 at 3:30pm.

## 2013-06-23 NOTE — Telephone Encounter (Signed)
If a neb treatment at home doesn't help, then maybe [name] could go by at some point and see if there is really any change. If nec, perhaps TO could see her if I don't have routine time slot.

## 2013-06-23 NOTE — Telephone Encounter (Signed)
Spoke with [name] BridgeMartha. Pt is complaining of SOB x1 week. I could not get a lot of detail from [name] BridgeMartha due to the fact that [name] BridgeMartha and the pt do not live together. We are unaware if the pt is having chest tightness or coughing. [name] BridgeMartha does report that the past few times she has been around the pt, she is very fatigued. Would like CY recs. Does she need an appointment?  Allergies  Allergen Reactions  . Codeine   . Hydrocod Polst-Cpm Polst Er   . Hydrocodone   . Hydromorphone Hcl     REACTION: itching  . Morphine     REACTION: itching    Current Outpatient Prescriptions on File Prior to Visit  Medication Sig Dispense Refill  . acetaminophen (TYLENOL) 500 MG tablet Take 500 mg by mouth every 6 (six) hours as needed.      Marland Kitchen. albuterol (PROVENTIL) (2.5 MG/3ML) 0.083% nebulizer solution Take 3 mLs (2.5 mg total) by nebulization every 6 (six) hours as needed for wheezing or shortness of breath. DX:  493.90  360 mL  3  . Dextromethorphan-Guaifenesin (MUCINEX DM MAXIMUM STRENGTH PO) Take 1 tablet by mouth 2 (two) times daily.      . diphenhydrAMINE (BENADRYL CHILDRENS ALLERGY) 12.5 MG/5ML liquid Take 12.5 mg by mouth 4 (four) times daily as needed for allergies.       No current facility-administered medications on file prior to visit.    CY - please advise. Thanks.

## 2013-06-24 ENCOUNTER — Ambulatory Visit (INDEPENDENT_AMBULATORY_CARE_PROVIDER_SITE_OTHER)
Admission: RE | Admit: 2013-06-24 | Discharge: 2013-06-24 | Disposition: A | Discharge status: Home / Self Care | Payer: Medicare Other | Source: Ambulatory Visit | Attending: Adult Health | Admitting: Adult Health

## 2013-06-24 ENCOUNTER — Ambulatory Visit (INDEPENDENT_AMBULATORY_CARE_PROVIDER_SITE_OTHER): Payer: Medicare Other | Admitting: Adult Health

## 2013-06-24 ENCOUNTER — Encounter: Payer: Self-pay | Admitting: Adult Health

## 2013-06-24 VITALS — BP 126/68 | HR 73 | Temp 97.7°F | Ht 61.75 in | Wt 145.0 lb

## 2013-06-24 DIAGNOSIS — J209 Acute bronchitis, unspecified: Secondary | ICD-10-CM

## 2013-06-24 DIAGNOSIS — J45909 Unspecified asthma, uncomplicated: Secondary | ICD-10-CM

## 2013-06-24 MED ORDER — PREDNISONE 10 MG PO TABS: ORAL_TABLET | ORAL | Status: DC

## 2013-06-24 MED ORDER — BUDESONIDE 0.25 MG/2ML IN SUSP: 0.2500 mg | Freq: Two times a day (BID) | RESPIRATORY_TRACT | Status: DC

## 2013-06-24 NOTE — Patient Instructions (Addendum)
Prednisone taper over  the next week. Mucinex DM twice daily as needed. For cough and congestion. Fluids and rest. Begin budesonide nebulizer twice daily. Follow up Dr. Maple HudsonYoung in 6  weeks and as needed I will call with chest x-ray results Please contact office for sooner follow up if symptoms do not improve or worsen or seek emergency care

## 2013-06-24 NOTE — Progress Notes (Signed)
Patient ID: [name], female    DOB: Apr 08, 1919, 80 y.o.   MRN: [id number]    Brief patient profile:   89yoF never smoker, here with daughter(pharmacist) , followed for asthma and dyspnea with complicating co-morbidities reviewed.    08/12/12-  , here with daughter(pharmacist) , followed for asthma and dyspnea with complicating co-morbidities reviewed.    ACUTE VISIT: increased cough-productive-gray in color since Wednesday, SOB and wheezing as well. Denies any chills.  Using nebulizer about TID now. Increased cough for one week as described. Denies fever, swollen glands purulent sputum, chest pain or symptoms suggesting non-respiratory infection. CXR 03/08/12- IMPRESSION:  Chronic changes as above. No acute findings.     11/07/12-   here with daughter(pharmacist) , followed for asthma and dyspnea with complicating co-morbidities reviewed.  ACUTE VISIT: SOB getting worse since last visit-worse with activity; unsure if happening when lying down. Gradually worsening dyspnea with exertion. Cough productive scant white sputum. No wheeze. Using nebulizer more. CXR 08/13/12 IMPRESSION:  Stable COPD and chronic changes. No acute cardiopulmonary disease.  Sample and script Breo Ellipta    1 puff, then rinse mouth, just once every day Ok to use the nebulizer machine up to 4 times daily as needed   01/23/2013 acute  ov/Wert re asthma/ worse cough so stopped breo Chief Complaint  Patient presents with  . acute office visit    Pt c/o increased SOB, cough and wheezing x 2-3 weeks.   cough is mostly dry. Sometimes sob at rest, worse at hs. Better p neb using avg 4x daily  No obvious daytime variabilty or assoc  cp or chest tightness overt sinus or hb symptoms. No unusual exp hx or h/o childhood pna/ asthma or knowledge of premature birth.  Also denies any obvious fluctuation of symptoms with weather or environmental changes or other aggravating or alleviating factors except as outlined above   Current Medications, Allergies, Past Medical History, Past Surgical History, Family History, and Social History were reviewed in [employer] record.  02/06/13- 89yoF never smoker, here with daughter(pharmacist) , followed for asthma and dyspnea with complicating co-morbidities reviewed.   FOLLOWS FOR: seen last by MW on 01-23-13; Feels like Elwin Sleight is not effective.  Daughter here Saw no benefit from [place]. Frontal headache persists, treated with Tylenol at bedtime. Denies nasal or sinus discomfort. Minor cough. No wheeze. Steroids flared acne. Daughter thought maybe acid blocker helped the cough some. We talked again about reflux CXR 08/15/12 IMPRESSION:  Stable COPD and chronic changes. No acute cardiopulmonary disease.  Original Report Authenticated By: Charlett Nose, M.D.   03/19/13- 89yoF never smoker, here with daughter(pharmacist) , followed for asthma and dyspnea with complicating co-morbidities reviewed.  Refuses flu shot- discussed FOLLOWS FOR:  Increased SOB, tightness in chest and cough w/ clear mucus x2 wks Caught cold. Frontal sinus pressure, nasal congestion-clear mucus, persistent cough- clear mucus. No fever, sore throat, ears or purulent. Mucinex-DM some help.   06/24/2013 Acute OV Complains of increased SOB, prod cough, and wheezing,  occ PND x1 week. Coughing up white mucus. No discolored mucus.  Denies nausea, vomiting, head congestion, edema. Lives in Independent Living.  CXR today shows COPD changes , no acute process noted.  Daughter is with her today .  Not on maintence asthma meds . Had difficulty in past with inhalers- could not coordinate breaths.  Uses albuterol nebs As needed     ROS Constitutional:   No  weight loss, night sweats,  Fevers, chills, fatigue, or  lassitude.  HEENT:   No headaches,  Difficulty swallowing,  Tooth/dental problems, or  Sore throat,                No sneezing, itching, ear ache,  +nasal congestion, post  nasal drip,   CV:  No chest pain,  Orthopnea, PND, swelling in lower extremities, anasarca, dizziness, palpitations, syncope.   GI  No heartburn, indigestion, abdominal pain, nausea, vomiting, diarrhea, change in bowel habits, loss of appetite, bloody stools.   Resp:    No chest wall deformity  Skin: no rash or lesions.  GU: no dysuria, change in color of urine, no urgency or frequency.  No flank pain, no hematuria   MS:  No joint pain or swelling.  No decreased range of motion.  No back pain.  Psych:  No change in mood or affect. No depression or anxiety.  No memory loss.         Objective:   OBJ- Physical Exam GEN: A/Ox3; pleasant , NAD, elderly   HEENT:  [place]/AT,  EACs-clear, TMs-wnl, NOSE-clear, THROAT-clear, no lesions, no postnasal drip or exudate noted.   NECK:  Supple w/ fair ROM; no JVD; normal carotid impulses w/o bruits; no thyromegaly or nodules palpated; no lymphadenopathy.  RESP  Trace exp wheezes, no accessory muscle use, no dullness to percussion  CARD:  RRR, no m/r/g  , no peripheral edema, pulses intact, no cyanosis or clubbing.  GI:   Soft & nt; nml bowel sounds; no organomegaly or masses detected.  Musco: Warm bil, no deformities or joint swelling noted.   Neuro: alert, no focal deficits noted.    Skin: Warm, no lesions or rashes

## 2013-06-25 DIAGNOSIS — J45909 Unspecified asthma, uncomplicated: Secondary | ICD-10-CM | POA: Insufficient documentation

## 2013-06-25 NOTE — Assessment & Plan Note (Addendum)
Mild flare -   Plan  Prednisone taper over  the next week. Mucinex DM twice daily as needed. For cough and congestion. Fluids and rest. Begin budesonide nebulizer twice daily. Follow up Dr. Maple HudsonYoung in 6  weeks and as needed I will call with chest x-ray results Please contact office for sooner follow up if symptoms do not improve or worsen or seek emergency care

## 2013-06-25 NOTE — Assessment & Plan Note (Deleted)
Slow to resolve flare  cxr w/ no acute process   Plan  Prednisone taper over  the next week. Mucinex DM twice daily as needed. For cough and congestion. Fluids and rest. Begin budesonide nebulizer twice daily. Follow up Dr. Maple HudsonYoung in 6  weeks and as needed I will call with chest x-ray results .Please contact office for sooner follow up if symptoms do not improve or worsen or seek emergency care

## 2013-06-27 ENCOUNTER — Telehealth: Payer: Self-pay | Admitting: Internal Medicine

## 2013-06-27 MED ORDER — ALBUTEROL SULFATE (2.5 MG/3ML) 0.083% IN NEBU: 2.5000 mg | INHALATION_SOLUTION | Freq: Four times a day (QID) | RESPIRATORY_TRACT | Status: DC | PRN

## 2013-06-27 NOTE — Telephone Encounter (Signed)
Called and spoke with daughter spouse. He reports pt needs a refill on her albuterol neb sent to [facility]. I advised will do so. Nothing further needed

## 2013-08-05 ENCOUNTER — Ambulatory Visit: Payer: Medicare Other | Admitting: Internal Medicine

## 2013-08-07 ENCOUNTER — Telehealth: Payer: Self-pay | Admitting: Internal Medicine

## 2013-08-07 NOTE — Telephone Encounter (Signed)
Spoke with the pt  She states that she needs to know when to f/u again  I advised next appt is already scheduled for 08/26/13 and she should keep this appt  She verbalized understanding and nothing further needed

## 2013-08-15 ENCOUNTER — Emergency Department (HOSPITAL_BASED_OUTPATIENT_CLINIC_OR_DEPARTMENT_OTHER): Payer: Medicare Other

## 2013-08-15 ENCOUNTER — Encounter (HOSPITAL_BASED_OUTPATIENT_CLINIC_OR_DEPARTMENT_OTHER): Payer: Self-pay | Admitting: Emergency Medicine

## 2013-08-15 ENCOUNTER — Emergency Department (HOSPITAL_BASED_OUTPATIENT_CLINIC_OR_DEPARTMENT_OTHER)
Admission: EM | Admit: 2013-08-15 | Discharge: 2013-08-15 | Disposition: A | Discharge status: Home / Self Care | Payer: Medicare Other | Attending: Emergency Medicine | Admitting: Emergency Medicine

## 2013-08-15 DIAGNOSIS — K5792 Diverticulitis of intestine, part unspecified, without perforation or abscess without bleeding: Secondary | ICD-10-CM

## 2013-08-15 DIAGNOSIS — Z862 Personal history of diseases of the blood and blood-forming organs and certain disorders involving the immune mechanism: Secondary | ICD-10-CM | POA: Insufficient documentation

## 2013-08-15 DIAGNOSIS — J45909 Unspecified asthma, uncomplicated: Secondary | ICD-10-CM | POA: Insufficient documentation

## 2013-08-15 DIAGNOSIS — Z9089 Acquired absence of other organs: Secondary | ICD-10-CM | POA: Insufficient documentation

## 2013-08-15 DIAGNOSIS — K5732 Diverticulitis of large intestine without perforation or abscess without bleeding: Secondary | ICD-10-CM | POA: Insufficient documentation

## 2013-08-15 DIAGNOSIS — Z8669 Personal history of other diseases of the nervous system and sense organs: Secondary | ICD-10-CM | POA: Insufficient documentation

## 2013-08-15 DIAGNOSIS — F0390 Unspecified dementia without behavioral disturbance: Secondary | ICD-10-CM | POA: Insufficient documentation

## 2013-08-15 DIAGNOSIS — I2510 Atherosclerotic heart disease of native coronary artery without angina pectoris: Secondary | ICD-10-CM | POA: Insufficient documentation

## 2013-08-15 DIAGNOSIS — Z8659 Personal history of other mental and behavioral disorders: Secondary | ICD-10-CM | POA: Insufficient documentation

## 2013-08-15 DIAGNOSIS — Z79899 Other long term (current) drug therapy: Secondary | ICD-10-CM | POA: Insufficient documentation

## 2013-08-15 DIAGNOSIS — IMO0002 Reserved for concepts with insufficient information to code with codable children: Secondary | ICD-10-CM | POA: Insufficient documentation

## 2013-08-15 LAB — COMPREHENSIVE METABOLIC PANEL
ALT: 15 U/L (ref 0–35)
AST: 24 U/L (ref 0–37)
Albumin: 4.3 g/dL (ref 3.5–5.2)
Alkaline Phosphatase: 78 U/L (ref 39–117)
BILIRUBIN TOTAL: 0.7 mg/dL (ref 0.3–1.2)
BUN: 23 mg/dL (ref 6–23)
CALCIUM: 9.9 mg/dL (ref 8.4–10.5)
CHLORIDE: 97 meq/L (ref 96–112)
CO2: 26 meq/L (ref 19–32)
CREATININE: 1 mg/dL (ref 0.50–1.10)
GFR, EST AFRICAN AMERICAN: 55 mL/min — AB (ref >90)
GFR, EST NON AFRICAN AMERICAN: 47 mL/min — AB (ref >90)
GLUCOSE: 116 mg/dL — AB (ref 70–99)
Potassium: 4.2 mEq/L (ref 3.7–5.3)
Sodium: 138 mEq/L (ref 137–147)
Total Protein: 7.8 g/dL (ref 6.0–8.3)

## 2013-08-15 LAB — CBC WITH DIFFERENTIAL/PLATELET
BAND NEUTROPHILS: 1 % (ref 0–10)
BASOS PCT: 0 % (ref 0–1)
Basophils Absolute: 0 10*3/uL (ref 0.0–0.1)
EOS PCT: 0 % (ref 0–5)
Eosinophils Absolute: 0 10*3/uL (ref 0.0–0.7)
HEMATOCRIT: 44.4 % (ref 36.0–46.0)
HEMOGLOBIN: 14.9 g/dL (ref 12.0–15.0)
LYMPHS ABS: 0.9 10*3/uL (ref 0.7–4.0)
Lymphocytes Relative: 9 % — ABNORMAL LOW (ref 12–46)
MCH: 29.5 pg (ref 26.0–34.0)
MCHC: 33.6 g/dL (ref 30.0–36.0)
MCV: 87.9 fL (ref 78.0–100.0)
MONOS PCT: 6 % (ref 3–12)
Monocytes Absolute: 0.6 10*3/uL (ref 0.1–1.0)
NEUTROS ABS: 8.5 10*3/uL — AB (ref 1.7–7.7)
Neutrophils Relative %: 84 % — ABNORMAL HIGH (ref 43–77)
Platelets: 87 10*3/uL — ABNORMAL LOW (ref 150–400)
RBC: 5.05 MIL/uL (ref 3.87–5.11)
RDW: 13.4 % (ref 11.5–15.5)
WBC: 10 10*3/uL (ref 4.0–10.5)

## 2013-08-15 LAB — URINALYSIS, ROUTINE W REFLEX MICROSCOPIC
BILIRUBIN URINE: NEGATIVE
GLUCOSE, UA: NEGATIVE mg/dL
KETONES UR: 15 mg/dL — AB
Leukocytes, UA: NEGATIVE
Nitrite: NEGATIVE
PROTEIN: NEGATIVE mg/dL
Specific Gravity, Urine: 1.018 (ref 1.005–1.030)
UROBILINOGEN UA: 0.2 mg/dL (ref 0.0–1.0)
pH: 5.5 (ref 5.0–8.0)

## 2013-08-15 LAB — URINE MICROSCOPIC-ADD ON

## 2013-08-15 LAB — LIPASE, BLOOD: LIPASE: 52 U/L (ref 11–59)

## 2013-08-15 MED ORDER — CIPROFLOXACIN HCL 500 MG PO TABS
500.0000 mg | ORAL_TABLET | Freq: Once | ORAL | Status: AC
  Administered 2013-08-15: 500 mg via ORAL
  Filled 2013-08-15: qty 1

## 2013-08-15 MED ORDER — METRONIDAZOLE 500 MG PO TABS: 500.0000 mg | ORAL_TABLET | Freq: Two times a day (BID) | ORAL | Status: DC

## 2013-08-15 MED ORDER — IOHEXOL 300 MG/ML  SOLN
100.0000 mL | Freq: Once | INTRAMUSCULAR | Status: AC | PRN
  Administered 2013-08-15: 100 mL via INTRAVENOUS

## 2013-08-15 MED ORDER — CIPROFLOXACIN HCL 500 MG PO TABS: 500.0000 mg | ORAL_TABLET | Freq: Two times a day (BID) | ORAL | Status: DC

## 2013-08-15 MED ORDER — SODIUM CHLORIDE 0.9 % IV BOLUS (SEPSIS)
500.0000 mL | Freq: Once | INTRAVENOUS | Status: AC
  Administered 2013-08-15: 500 mL via INTRAVENOUS

## 2013-08-15 MED ORDER — METRONIDAZOLE 500 MG PO TABS
500.0000 mg | ORAL_TABLET | Freq: Once | ORAL | Status: AC
  Administered 2013-08-15: 500 mg via ORAL
  Filled 2013-08-15: qty 1

## 2013-08-15 MED ORDER — IOHEXOL 300 MG/ML  SOLN
50.0000 mL | Freq: Once | INTRAMUSCULAR | Status: AC | PRN
  Administered 2013-08-15: 50 mL via ORAL

## 2013-08-15 MED ORDER — ONDANSETRON HCL 4 MG/2ML IJ SOLN
4.0000 mg | Freq: Once | INTRAMUSCULAR | Status: AC
  Administered 2013-08-15: 4 mg via INTRAVENOUS
  Filled 2013-08-15: qty 2

## 2013-08-15 NOTE — ED Notes (Signed)
Abdominal pain since this am. Pale. Nausea. No vomiting.

## 2013-08-15 NOTE — ED Notes (Signed)
Patient transported to CT 

## 2013-08-15 NOTE — ED Notes (Signed)
Pt unable to void at this time, water provided, pt denies nausea, vomiting or diarrhea

## 2013-08-15 NOTE — ED Provider Notes (Signed)
CSN: [id number]     Arrival date & time 08/15/13  1913 History   First MD Initiated Contact with Patient 08/15/13 2035 This chart was scribed for Charles B. Bernette Mayers, MD by Valera Castle, ED Scribe. This patient was seen in room MH04/MH04 and the patient's care was started at 8:39 PM.     Chief Complaint  Patient presents with  . Abdominal Pain   Level 5 Caveat - Dementia The history is provided by the patient and a relative. The history is limited by the condition of the patient. No language interpreter was used.   HPI Comments: [name] is a 80 y.o. female who presents to the Emergency Department complaining of constant, bilateral abdominal pain, onset this morning, with associated nausea. She denies her pain feeling like her h/o diverticulitis. She denies blood in her stool, urine, fever, and any other associated symptoms. She reports h/o cholecystectomy, denies h/o appendectomy.   PCP - No PCP Per Patient  Past Medical History  Diagnosis Date  . Insomnia, unspecified   . Leukocytopenia, unspecified   . Anemia, unspecified   . Other left bundle branch block   . Coronary atherosclerosis of unspecified type of vessel, native or graft   . Heart attack   . Esophageal reflux   . Unspecified asthma(493.90)   . Paralysis agitans   . Anxiety state, unspecified   . Positive PPD    Past Surgical History  Procedure Laterality Date  . Orif hip fracture  05-2008    partial hip replacement  . Cholecystectomy    . Tonsillectomy     Family History  Problem Relation Age of Onset  . Cancer Mother    History  Substance Use Topics  . Smoking status: Never Smoker   . Smokeless tobacco: Not on file  . Alcohol Use: No   OB History   Grav Para Term Preterm Abortions TAB SAB Ect Mult Living                 Review of Systems  Unable to perform ROS: Dementia   Allergies  Codeine; Hydrocod polst-cpm polst er; Hydrocodone; Hydromorphone hcl; and Morphine  Home Medications   Current  Outpatient Rx  Name  Route  Sig  Dispense  Refill  . acetaminophen (TYLENOL) 500 MG tablet   Oral   Take 500 mg by mouth every 6 (six) hours as needed.         Marland Kitchen albuterol (PROVENTIL) (2.5 MG/3ML) 0.083% nebulizer solution   Nebulization   Take 3 mLs (2.5 mg total) by nebulization every 6 (six) hours as needed for wheezing or shortness of breath. DX:  493.90   360 mL   3   . budesonide (PULMICORT) 0.25 MG/2ML nebulizer solution   Nebulization   Take 2 mLs (0.25 mg total) by nebulization 2 (two) times daily.   120 mL   12   . Dextromethorphan-Guaifenesin (MUCINEX DM MAXIMUM STRENGTH PO)   Oral   Take 1 tablet by mouth 2 (two) times daily as needed.          . diphenhydrAMINE (BENADRYL CHILDRENS ALLERGY) 12.5 MG/5ML liquid   Oral   Take 12.5 mg by mouth 4 (four) times daily as needed for allergies.         . predniSONE (DELTASONE) 10 MG tablet      4 tabs for 2 days, then 3 tabs for 2 days, 2 tabs for 2 days, then 1 tab for 2 days, then stop  20 tablet   0    BP 152/62  Pulse 80  Temp(Src) 97.6 F (36.4 C) (Oral)  Resp 20  Wt 145 lb (65.772 kg)  SpO2 94%  Physical Exam  Nursing note and vitals reviewed. Constitutional: She is oriented to person, place, and time. She appears well-developed and well-nourished.  HENT:  Head: Normocephalic and atraumatic.  Eyes: EOM are normal. Pupils are equal, round, and reactive to light.  Neck: Normal range of motion. Neck supple.  Cardiovascular: Normal rate, normal heart sounds and intact distal pulses.   Pulmonary/Chest: Effort normal and breath sounds normal.  Abdominal: She exhibits no distension. Bowel sounds are increased. There is tenderness (tenderness over LLQ). There is no guarding.  Musculoskeletal: Normal range of motion. She exhibits no edema and no tenderness.  Neurological: She is alert and oriented to person, place, and time. She has normal strength. No cranial nerve deficit or sensory deficit.  Skin: Skin is  warm and dry. No rash noted.  Psychiatric: She has a normal mood and affect.    ED Course  Procedures (including critical care time)  DIAGNOSTIC STUDIES: Oxygen Saturation is 94% on room air, adequate by my interpretation.    COORDINATION OF CARE: 8:43 PM-Discussed treatment plan which includes IV fluids, nausea medication, CT abdomen, and blood work with pt at bedside and pt agreed to plan.   Results for orders placed during the hospital encounter of 08/15/13  URINALYSIS, ROUTINE W REFLEX MICROSCOPIC      Result Value Ref Range   Color, Urine YELLOW  YELLOW   APPearance CLEAR  CLEAR   Specific Gravity, Urine 1.018  1.005 - 1.030   pH 5.5  5.0 - 8.0   Glucose, UA NEGATIVE  NEGATIVE mg/dL   Hgb urine dipstick MODERATE (*) NEGATIVE   Bilirubin Urine NEGATIVE  NEGATIVE   Ketones, ur 15 (*) NEGATIVE mg/dL   Protein, ur NEGATIVE  NEGATIVE mg/dL   Urobilinogen, UA 0.2  0.0 - 1.0 mg/dL   Nitrite NEGATIVE  NEGATIVE   Leukocytes, UA NEGATIVE  NEGATIVE  URINE MICROSCOPIC-ADD ON      Result Value Ref Range   Squamous Epithelial / LPF RARE  RARE   RBC / HPF 3-6  <3 RBC/hpf   Bacteria, UA RARE  RARE  CBC WITH DIFFERENTIAL      Result Value Ref Range   WBC 10.0  4.0 - 10.5 K/uL   RBC 5.05  3.87 - 5.11 MIL/uL   Hemoglobin 14.9  12.0 - 15.0 g/dL   HCT 16.1  09.6 - 04.5 %   MCV 87.9  78.0 - 100.0 fL   MCH 29.5  26.0 - 34.0 pg   MCHC 33.6  30.0 - 36.0 g/dL   RDW 40.9  81.1 - 91.4 %   Platelets 87 (*) 150 - 400 K/uL   Neutrophils Relative % 84 (*) 43 - 77 %   Lymphocytes Relative 9 (*) 12 - 46 %   Monocytes Relative 6  3 - 12 %   Eosinophils Relative 0  0 - 5 %   Basophils Relative 0  0 - 1 %   Band Neutrophils 1  0 - 10 %   Neutro Abs 8.5 (*) 1.7 - 7.7 K/uL   Lymphs Abs 0.9  0.7 - 4.0 K/uL   Monocytes Absolute 0.6  0.1 - 1.0 K/uL   Eosinophils Absolute 0.0  0.0 - 0.7 K/uL   Basophils Absolute 0.0  0.0 - 0.1 K/uL  COMPREHENSIVE METABOLIC  PANEL      Result Value Ref Range    Sodium 138  137 - 147 mEq/L   Potassium 4.2  3.7 - 5.3 mEq/L   Chloride 97  96 - 112 mEq/L   CO2 26  19 - 32 mEq/L   Glucose, Bld 116 (*) 70 - 99 mg/dL   BUN 23  6 - 23 mg/dL   Creatinine, Ser 1.911.00  0.50 - 1.10 mg/dL   Calcium 9.9  8.4 - 47.810.5 mg/dL   Total Protein 7.8  6.0 - 8.3 g/dL   Albumin 4.3  3.5 - 5.2 g/dL   AST 24  0 - 37 U/L   ALT 15  0 - 35 U/L   Alkaline Phosphatase 78  39 - 117 U/L   Total Bilirubin 0.7  0.3 - 1.2 mg/dL   GFR calc non Af Amer 47 (*) >90 mL/min   GFR calc Af Amer 55 (*) >90 mL/min  LIPASE, BLOOD      Result Value Ref Range   Lipase 52  11 - 59 U/L   No results found.   EKG Interpretation None     Medications  sodium chloride 0.9 % bolus 500 mL (0 mLs Intravenous Stopped 08/15/13 2138)  ondansetron (ZOFRAN) injection 4 mg (4 mg Intravenous Given 08/15/13 2135)   MDM   Final diagnoses:  Diverticulitis    Pt states she's feeling better and wants to go home. CT results reviewed and discussed with family at bedside. Will treat for diverticulitis, Cipro/Flagyl has been used successfully in the past. PCP followup for recheck.    I personally performed the services described in this documentation, which was scribed in my presence. The recorded information has been reviewed and is accurate.      Charles B. Bernette MayersSheldon, MD 08/15/13 [phone]

## 2013-08-15 NOTE — Discharge Instructions (Signed)
Diverticulitis °A diverticulum is a small pouch or sac on the colon. Diverticulosis is the presence of these diverticula on the colon. Diverticulitis is the irritation (inflammation) or infection of diverticula. °CAUSES  °The colon and its diverticula contain bacteria. If food particles block the tiny opening to a diverticulum, the bacteria inside can grow and cause an increase in pressure. This leads to infection and inflammation and is called diverticulitis. °SYMPTOMS  °· Abdominal pain and tenderness. Usually, the pain is located on the left side of your abdomen. However, it could be located elsewhere. °· Fever. °· Bloating. °· Feeling sick to your stomach (nausea). °· Throwing up (vomiting). °· Abnormal stools. °DIAGNOSIS  °Your caregiver will take a history and perform a physical exam. Since many things can cause abdominal pain, other tests may be necessary. Tests may include: °· Blood tests. °· Urine tests. °· X-ray of the abdomen. °· CT scan of the abdomen. °Sometimes, surgery is needed to determine if diverticulitis or other conditions are causing your symptoms. °TREATMENT  °Most of the time, you can be treated without surgery. Treatment includes: °· Resting the bowels by only having liquids for a few days. As you improve, you will need to eat a low-fiber diet. °· Intravenous (IV) fluids if you are losing body fluids (dehydrated). °· Antibiotic medicines that treat infections may be given. °· Pain and nausea medicine, if needed. °· Surgery if the inflamed diverticulum has burst. °HOME CARE INSTRUCTIONS  °· Try a clear liquid diet (broth, tea, or water for as long as directed by your caregiver). You may then gradually begin a low-fiber diet as tolerated.  °A low-fiber diet is a diet with less than 10 grams of fiber. Choose the foods below to reduce fiber in the diet: °· White breads, cereals, rice, and pasta. °· Cooked fruits and vegetables or soft fresh fruits and vegetables without the skin. °· Ground or  well-cooked tender beef, ham, veal, lamb, pork, or poultry. °· Eggs and seafood. °· After your diverticulitis symptoms have improved, your caregiver may put you on a high-fiber diet. A high-fiber diet includes 14 grams of fiber for every 1000 calories consumed. For a standard 2000 calorie diet, you would need 28 grams of fiber. Follow these diet guidelines to help you increase the fiber in your diet. It is important to slowly increase the amount fiber in your diet to avoid gas, constipation, and bloating. °· Choose whole-grain breads, cereals, pasta, and brown rice. °· Choose fresh fruits and vegetables with the skin on. Do not overcook vegetables because the more vegetables are cooked, the more fiber is lost. °· Choose more nuts, seeds, legumes, dried peas, beans, and lentils. °· Look for food products that have greater than 3 grams of fiber per serving on the Nutrition Facts label. °· Take all medicine as directed by your caregiver. °· If your caregiver has given you a follow-up appointment, it is very important that you go. Not going could result in lasting (chronic) or permanent injury, pain, and disability. If there is any problem keeping the appointment, call to reschedule. °SEEK MEDICAL CARE IF:  °· Your pain does not improve. °· You have a hard time advancing your diet beyond clear liquids. °· Your bowel movements do not return to normal. °SEEK IMMEDIATE MEDICAL CARE IF:  °· Your pain becomes worse. °· You have an oral temperature above 102° F (38.9° C), not controlled by medicine. °· You have repeated vomiting. °· You have bloody or black, tarry stools. °·   Symptoms that brought you to your caregiver become worse or are not getting better. °MAKE SURE YOU:  °· Understand these instructions. °· Will watch your condition. °· Will get help right away if you are not doing well or get worse. °Document Released: 03/01/2005 Document Revised: 08/14/2011 Document Reviewed: 06/27/2010 °[employer]® Patient Information  ©2014 [employer]. ° °

## 2013-08-26 ENCOUNTER — Ambulatory Visit: Payer: Medicare Other | Admitting: Internal Medicine

## 2013-09-03 ENCOUNTER — Telehealth: Payer: Self-pay | Admitting: Internal Medicine

## 2013-09-03 MED ORDER — BUDESONIDE 0.25 MG/2ML IN SUSP: 0.2500 mg | Freq: Two times a day (BID) | RESPIRATORY_TRACT | Status: DC

## 2013-09-03 NOTE — Telephone Encounter (Signed)
rx sent with diagnosis code.[name] CurieJennifer Castillo, CMA

## 2013-12-04 ENCOUNTER — Other Ambulatory Visit: Payer: Self-pay | Admitting: Internal Medicine

## 2013-12-04 MED ORDER — ALBUTEROL SULFATE (2.5 MG/3ML) 0.083% IN NEBU: 2.5000 mg | INHALATION_SOLUTION | Freq: Four times a day (QID) | RESPIRATORY_TRACT | Status: DC | PRN

## 2014-01-13 ENCOUNTER — Emergency Department (HOSPITAL_COMMUNITY): Payer: Medicare Other

## 2014-01-13 ENCOUNTER — Emergency Department (HOSPITAL_COMMUNITY)
Admission: EM | Admit: 2014-01-13 | Discharge: 2014-01-14 | Disposition: A | Discharge status: Home / Self Care | Payer: Medicare Other | Attending: Emergency Medicine | Admitting: Emergency Medicine

## 2014-01-13 ENCOUNTER — Encounter (HOSPITAL_COMMUNITY): Payer: Self-pay | Admitting: Emergency Medicine

## 2014-01-13 DIAGNOSIS — Z862 Personal history of diseases of the blood and blood-forming organs and certain disorders involving the immune mechanism: Secondary | ICD-10-CM | POA: Insufficient documentation

## 2014-01-13 DIAGNOSIS — Z8719 Personal history of other diseases of the digestive system: Secondary | ICD-10-CM | POA: Diagnosis not present

## 2014-01-13 DIAGNOSIS — R079 Chest pain, unspecified: Secondary | ICD-10-CM | POA: Insufficient documentation

## 2014-01-13 DIAGNOSIS — J45909 Unspecified asthma, uncomplicated: Secondary | ICD-10-CM | POA: Diagnosis not present

## 2014-01-13 DIAGNOSIS — Z8679 Personal history of other diseases of the circulatory system: Secondary | ICD-10-CM | POA: Diagnosis not present

## 2014-01-13 LAB — CBC
HCT: 39.9 % (ref 36.0–46.0)
Hemoglobin: 13.1 g/dL (ref 12.0–15.0)
MCH: 28.4 pg (ref 26.0–34.0)
MCHC: 32.8 g/dL (ref 30.0–36.0)
MCV: 86.6 fL (ref 78.0–100.0)
Platelets: 98 10*3/uL — ABNORMAL LOW (ref 150–400)
RBC: 4.61 MIL/uL (ref 3.87–5.11)
RDW: 13.7 % (ref 11.5–15.5)
WBC: 5.5 10*3/uL (ref 4.0–10.5)

## 2014-01-13 LAB — I-STAT TROPONIN, ED: Troponin i, poc: 0 ng/mL (ref 0.00–0.08)

## 2014-01-13 LAB — BASIC METABOLIC PANEL
Anion gap: 11 (ref 5–15)
BUN: 22 mg/dL (ref 6–23)
CO2: 27 mEq/L (ref 19–32)
Calcium: 9.8 mg/dL (ref 8.4–10.5)
Chloride: 100 mEq/L (ref 96–112)
Creatinine, Ser: 0.92 mg/dL (ref 0.50–1.10)
GFR calc Af Amer: 61 mL/min — ABNORMAL LOW (ref >90)
GFR calc non Af Amer: 52 mL/min — ABNORMAL LOW (ref >90)
Glucose, Bld: 106 mg/dL — ABNORMAL HIGH (ref 70–99)
Potassium: 4.4 mEq/L (ref 3.7–5.3)
Sodium: 138 mEq/L (ref 137–147)

## 2014-01-13 LAB — HEPATIC FUNCTION PANEL
ALBUMIN: 3.9 g/dL (ref 3.5–5.2)
ALK PHOS: 79 U/L (ref 39–117)
ALT: 18 U/L (ref 0–35)
AST: 24 U/L (ref 0–37)
Bilirubin, Direct: <0.2 mg/dL (ref 0.0–0.3)
Total Bilirubin: 0.4 mg/dL (ref 0.3–1.2)
Total Protein: 7.1 g/dL (ref 6.0–8.3)

## 2014-01-13 LAB — LIPASE, BLOOD: Lipase: 65 U/L — ABNORMAL HIGH (ref 11–59)

## 2014-01-13 LAB — PRO B NATRIURETIC PEPTIDE: Pro B Natriuretic peptide (BNP): 376.4 pg/mL (ref 0–450)

## 2014-01-13 MED ORDER — ASPIRIN 81 MG PO CHEW
324.0000 mg | CHEWABLE_TABLET | Freq: Once | ORAL | Status: AC
  Administered 2014-01-13: 324 mg via ORAL
  Filled 2014-01-13: qty 4

## 2014-01-13 NOTE — ED Provider Notes (Signed)
CSN: [id number]     Arrival date & time 01/13/14  2148 History   First MD Initiated Contact with Patient 01/13/14 2228     Chief Complaint  Patient presents with  . Chest Pain     (Consider location/radiation/quality/duration/timing/severity/associated sxs/prior Treatment) HPI  [name] is a 80 y.o. female with past medical history significant for coronary artery disease, dementia, resides in independent living, she is accompanied by her daughter complaining of 6 hours of right (note this contradicts nursing note lower anterior) chest pain onset this afternoon. Patient denies shortness of breath, nausea, vomiting, diaphoresis, abdominal pain, change in bowel habits, dark stool. Patient is incontinent of urine. Denies history of DVT or PE, calf pain or leg swelling, increasing peripheral edema. Pain resolved in the waiting room and patient has no symptoms at this time.  Past Medical History  Diagnosis Date  . Insomnia, unspecified   . Leukocytopenia, unspecified   . Anemia, unspecified   . Other left bundle branch block   . Coronary atherosclerosis of unspecified type of vessel, native or graft   . Heart attack   . Esophageal reflux   . Unspecified asthma(493.90)   . Paralysis agitans   . Anxiety state, unspecified   . Positive PPD    Past Surgical History  Procedure Laterality Date  . Orif hip fracture  05-2008    partial hip replacement  . Cholecystectomy    . Tonsillectomy     Family History  Problem Relation Age of Onset  . Cancer Mother    History  Substance Use Topics  . Smoking status: Passive Smoke Exposure - Never Smoker  . Smokeless tobacco: Not on file  . Alcohol Use: No   OB History   Grav Para Term Preterm Abortions TAB SAB Ect Mult Living                 Review of Systems  10 systems reviewed and found to be negative, except as noted in the HPI.   Allergies  Codeine; Hydrocod polst-cpm polst er; Hydrocodone; Hydromorphone hcl; and  Morphine  Home Medications   Prior to Admission medications   Medication Sig Start Date End Date Taking? Authorizing Provider  acetaminophen (TYLENOL) 500 MG tablet Take 500 mg by mouth every 6 (six) hours as needed.   Yes Historical Provider, MD  docusate sodium (COLACE) 100 MG capsule Take 100 mg by mouth at bedtime.   Yes Historical Provider, MD  OVER THE COUNTER MEDICATION Take 1 capsule by mouth daily. Probiotic supplement   Restora   Yes Historical Provider, MD  senna-docusate (SENOKOT-S) 8.6-50 MG per tablet Take 1 tablet by mouth every other day. Monday Wednesday Friday  Sub one tab for the colace   Yes Historical Provider, MD   BP 152/52  Pulse 75  Resp 17  SpO2 95% Physical Exam  Nursing note and vitals reviewed. Constitutional: She is oriented to person, place, and time. She appears well-developed and well-nourished. No distress.  HENT:  Head: Normocephalic and atraumatic.  Mouth/Throat: Oropharynx is clear and moist.  Eyes: Conjunctivae and EOM are normal. Pupils are equal, round, and reactive to light.  Neck: Normal range of motion. No JVD present.  Cardiovascular: Normal rate, regular rhythm and intact distal pulses.   Pulmonary/Chest: Effort normal and breath sounds normal. No stridor. No respiratory distress. She has no wheezes. She has no rales. She exhibits no tenderness.  Abdominal: Soft. Bowel sounds are normal. She exhibits no distension and no mass. There  is no tenderness. There is no rebound and no guarding.  Musculoskeletal: Normal range of motion. She exhibits no edema and no tenderness.  Neurological: She is alert and oriented to person, place, and time.  Psychiatric: She has a normal mood and affect.    ED Course  Procedures (including critical care time) Labs Review Labs Reviewed  CBC - Abnormal; Notable for the following:    Platelets 98 (*)    All other components within normal limits  BASIC METABOLIC PANEL - Abnormal; Notable for the following:     Glucose, Bld 106 (*)    GFR calc non Af Amer 52 (*)    GFR calc Af Amer 61 (*)    All other components within normal limits  LIPASE, BLOOD - Abnormal; Notable for the following:    Lipase 65 (*)    All other components within normal limits  PRO B NATRIURETIC PEPTIDE  HEPATIC FUNCTION PANEL  I-STAT TROPOININ, ED  Rosezena Sensor, ED    Imaging Review Dg Chest 2 View  01/13/2014   CLINICAL DATA:  Pain under the right breast.  EXAM: CHEST  2 VIEW  COMPARISON:  Chest x-ray 06/24/2013.  FINDINGS: Diffuse peribronchial cuffing. No acute consolidative airspace disease. No pleural effusions. No evidence of pulmonary edema. No pneumothorax. Heart size is normal. Upper mediastinal contours are within normal limits. Atherosclerosis in the thoracic aorta. Visualized bony thorax appears grossly intact. Surgical clips project over the right upper quadrant of the abdomen, compatible with prior cholecystectomy.  IMPRESSION: 1. Diffuse peribronchial cuffing which may suggest an acute bronchitis. 2. Atherosclerosis. 3. Status post cholecystectomy.   Electronically Signed   By: Trudie Reed M.D.   On: 01/13/2014 23:32     EKG Interpretation   Date/Time:  Tuesday January 13 2014 21:55:31 EDT Ventricular Rate:  82 PR Interval:  138 QRS Duration: 124 QT Interval:  410 QTC Calculation: 479 R Axis:   -38 Text Interpretation:  Normal sinus rhythm Left axis deviation Left bundle  branch block Abnormal ECG Sinus tachycardia NO LONGER PRESENT Confirmed by  Tresa Endo  MD, Maisie Fus ([id number]) on 01/14/2014 6:07:38 PM      MDM   Final diagnoses:  Right-sided chest pain    Filed Vitals:   01/13/14 2350  BP: 152/52  Pulse: 75  Resp: 17  SpO2: 95%    Medications  aspirin chewable tablet 324 mg (324 mg Oral Given 01/13/14 2334)    [name] is a 80 y.o. female presenting with 6 hours of right-sided chest pain now resolved. EKG is nonischemic with no arrhythmia, there is a mild tachycardia. Chest pain  would be very atypical for cardiac origin. Patient does not appear to have DVT clinically. Workup is otherwise unremarkable. I have advised the patient that she should be admitted to the hospital but the patient and her daughter are requesting discharge. This is a shared visit with the attending physician Dr. Juleen China who agrees with stability to discharge to home. Advise close followup with primary care. We have had an extensive discussion of return precautions.  Evaluation does not show pathology that would require ongoing emergent intervention or inpatient treatment. Pt is hemodynamically stable and mentating appropriately. Discussed findings and plan with patient/guardian, who agrees with care plan. All questions answered. Return precautions discussed and outpatient follow up given.      Wynetta Emery, PA-C 01/16/14 1655

## 2014-01-13 NOTE — ED Provider Notes (Signed)
Medical screening examination/treatment/procedure(s) were conducted as a shared visit with non-physician practitioner(s) and myself.  I personally evaluated the patient during the encounter.   EKG Interpretation None      EKG:  Rhythm: normal sinus Vent. rate 82 BPM PR interval 138 ms QRS duration 124 ms QT/QTc 410/479 ms LBBB LAD ST segments: NS ST changes Comparison: LBBB noted on previous   89yF with CP. Presenting with daughter. Very pleasant. 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 R sided CP which was sharp and began while sitting down. Constant for several hours up to point of resolving shortly after coming to ED. Currently no complaints. Denies any associated symptoms with her CP such as dyspnea, diaphoresis, nausea, dizziness. Didn't notice any change with exertion. EKG with LBBB. Seen on prior. Labs fine. CXR w/o acute abnormality. Will repeat trop. Pretty low suspicion for ACS or other emergent process. Likely DC home if remains normal.   Raeford RazorStephen Jermany Sundell, MD 01/14/14 0001

## 2014-01-13 NOTE — ED Notes (Signed)
Pt c/o chest pain that started around 3pm. Pt states pain was under right breast and was sharp in nature. Pt denies any nausea/vomiting, diaphoresis or any sob. Pt states she wasn't doing anything when pain started. Pt is currently pain free.

## 2014-01-13 NOTE — ED Notes (Signed)
Pt. reports intermittent left/lower chest pain onset this afternoon , mild SOB , denies nausea or diaphoresis .

## 2014-01-14 LAB — I-STAT TROPONIN, ED: Troponin i, poc: 0 ng/mL (ref 0.00–0.08)

## 2014-01-14 NOTE — Discharge Instructions (Signed)
Do not hesitate to return to the Emergency Department for any new, worsening or concerning symptoms.   If you do not have a primary care doctor you can establish one at the   [facility]: [address] [place] [id number] [phone]  After you establish care. Let them know you were seen in the emergency room. They must obtain records for further management.     Chest Pain (Nonspecific) It is often hard to give a diagnosis for the cause of chest pain. There is always a chance that your pain could be related to something serious, such as a heart attack or a blood clot in the lungs. You need to follow up with your doctor. HOME CARE  If antibiotic medicine was given, take it as directed by your doctor. Finish the medicine even if you start to feel better.  For the next few days, avoid activities that bring on chest pain. Continue physical activities as told by your doctor.  Do not use any tobacco products. This includes cigarettes, chewing tobacco, and e-cigarettes.  Avoid drinking alcohol.  Only take medicine as told by your doctor.  Follow your doctor's suggestions for more testing if your chest pain does not go away.  Keep all doctor visits you made. GET HELP IF:  Your chest pain does not go away, even after treatment.  You have a rash with blisters on your chest.  You have a fever. GET HELP RIGHT AWAY IF:   You have more pain or pain that spreads to your arm, neck, jaw, back, or belly (abdomen).  You have shortness of breath.  You cough more than usual or cough up blood.  You have very bad back or belly pain.  You feel sick to your stomach (nauseous) or throw up (vomit).  You have very bad weakness.  You pass out (faint).  You have chills. This is an emergency. Do not wait to see if the problems will go away. Call your local emergency services (911 in [place] Do not drive yourself to the hospital. MAKE SURE YOU:   Understand these  instructions.  Will watch your condition.  Will get help right away if you are not doing well or get worse. Document Released: 11/08/2007 Document Revised: 05/27/2013 Document Reviewed: 11/08/2007 [facility] Patient Information 2015 [employer], [place]. This information is not intended to replace advice given to you by your health care provider. Make sure you discuss any questions you have with your health care provider.

## 2014-01-14 NOTE — ED Notes (Signed)
Discharge instructions reviewed with pt. Pt verbalized understanding.   

## 2014-01-19 NOTE — ED Provider Notes (Signed)
Medical screening examination/treatment/procedure(s) were conducted as a shared visit with non-physician practitioner(s) and myself.  I personally evaluated the patient during the encounter.   EKG Interpretation   Date/Time:  Tuesday January 13 2014 21:55:31 EDT Ventricular Rate:  82 PR Interval:  138 QRS Duration: 124 QT Interval:  410 QTC Calculation: 479 R Axis:   -38 Text Interpretation:  Normal sinus rhythm Left axis deviation Left bundle  branch block Abnormal ECG Sinus tachycardia NO LONGER PRESENT Confirmed by  Tresa EndoKELLY  MD, Maisie FusHOMAS ([id number]) on 01/14/2014 6:07:38 PM     89yF with CP. Hx of CAD. R sided constant for ~6 hours, but now currently resolved. W/u today pretty unremarkable. Pt/daughter requesting to be discharged. I believe has medical decision making capability. I have some concerns, although fairly low for ACS. Doubt PE, dissection, infectious, etc. I feel pt reasonable for discharge per her request. Return precautions discussed. Outpt FU.   Raeford RazorStephen Hadden Steig, MD 01/19/14 [phone]

## 2014-01-29 ENCOUNTER — Other Ambulatory Visit: Payer: Self-pay | Admitting: Gastroenterology

## 2014-01-29 DIAGNOSIS — R1033 Periumbilical pain: Secondary | ICD-10-CM

## 2014-02-02 ENCOUNTER — Other Ambulatory Visit: Payer: Medicare Other

## 2014-02-19 ENCOUNTER — Ambulatory Visit
Admission: RE | Admit: 2014-02-19 | Discharge: 2014-02-19 | Disposition: A | Discharge status: Home / Self Care | Payer: Medicare Other | Source: Ambulatory Visit | Attending: Gastroenterology | Admitting: Gastroenterology

## 2014-02-19 DIAGNOSIS — R1033 Periumbilical pain: Secondary | ICD-10-CM

## 2014-02-19 MED ORDER — IOHEXOL 300 MG/ML  SOLN
100.0000 mL | Freq: Once | INTRAMUSCULAR | Status: AC | PRN
  Administered 2014-02-19: 100 mL via INTRAVENOUS

## 2014-04-05 DEATH — deceased
